# Patient Record
Sex: Female | Born: 1950
Health system: Southern US, Community
[De-identification: ages and names within clinical notes are randomized; demographics above are authoritative.]

## PROBLEM LIST (undated history)

## (undated) DIAGNOSIS — E559 Vitamin D deficiency, unspecified: Secondary | ICD-10-CM

## (undated) DIAGNOSIS — N8110 Cystocele, unspecified: Secondary | ICD-10-CM

## (undated) DIAGNOSIS — R55 Syncope and collapse: Secondary | ICD-10-CM

## (undated) DIAGNOSIS — T7840XA Allergy, unspecified, initial encounter: Secondary | ICD-10-CM

## (undated) HISTORY — DX: Allergy, unspecified, initial encounter: T78.40XA

## (undated) HISTORY — DX: Syncope and collapse: R55

## (undated) HISTORY — DX: Vitamin D deficiency, unspecified: E55.9

## (undated) HISTORY — PX: TONSILLECTOMY AND ADENOIDECTOMY: SUR1326

## (undated) HISTORY — PX: LEG SURGERY: SHX1003

## (undated) HISTORY — DX: Cystocele, unspecified: N81.10

---

## 2008-11-17 ENCOUNTER — Emergency Department (HOSPITAL_BASED_OUTPATIENT_CLINIC_OR_DEPARTMENT_OTHER): Admission: EM | Admit: 2008-11-17 | Discharge: 2008-11-17 | Payer: Self-pay | Admitting: Emergency Medicine

## 2008-11-17 ENCOUNTER — Ambulatory Visit: Payer: Self-pay | Admitting: Diagnostic Radiology

## 2009-07-14 ENCOUNTER — Other Ambulatory Visit: Admission: RE | Admit: 2009-07-14 | Discharge: 2009-07-14 | Payer: Self-pay | Admitting: Internal Medicine

## 2010-07-31 ENCOUNTER — Encounter: Admission: RE | Admit: 2010-07-31 | Discharge: 2010-07-31 | Payer: Self-pay | Admitting: Internal Medicine

## 2010-12-07 LAB — DIFFERENTIAL
Basophils Relative: 0 % (ref 0–1)
Eosinophils Absolute: 0.1 10*3/uL (ref 0.0–0.7)
Eosinophils Relative: 2 % (ref 0–5)
Monocytes Absolute: 0.5 10*3/uL (ref 0.1–1.0)
Monocytes Relative: 6 % (ref 3–12)

## 2010-12-07 LAB — BASIC METABOLIC PANEL
CO2: 26 mEq/L (ref 19–32)
Chloride: 107 mEq/L (ref 96–112)
GFR calc Af Amer: >60 mL/min (ref >60)
Sodium: 139 mEq/L (ref 135–145)

## 2010-12-07 LAB — CBC
HCT: 39.8 % (ref 36.0–46.0)
Hemoglobin: 13.7 g/dL (ref 12.0–15.0)
MCHC: 34.5 g/dL (ref 30.0–36.0)
MCV: 90.3 fL (ref 78.0–100.0)
RBC: 4.41 MIL/uL (ref 3.87–5.11)

## 2010-12-07 LAB — URINALYSIS, ROUTINE W REFLEX MICROSCOPIC
Specific Gravity, Urine: 1.005 (ref 1.005–1.030)
Urobilinogen, UA: 1 mg/dL (ref 0.0–1.0)

## 2010-12-07 LAB — URINE MICROSCOPIC-ADD ON

## 2010-12-07 LAB — POCT CARDIAC MARKERS: CKMB, poc: <1 ng/mL — ABNORMAL LOW (ref 1.0–8.0)

## 2013-12-14 ENCOUNTER — Other Ambulatory Visit (HOSPITAL_COMMUNITY)
Admission: RE | Admit: 2013-12-14 | Discharge: 2013-12-14 | Disposition: A | Discharge status: Home / Self Care | Payer: BC Managed Care – PPO | Source: Ambulatory Visit | Attending: Nurse Practitioner | Admitting: Nurse Practitioner

## 2013-12-14 ENCOUNTER — Encounter: Payer: Self-pay | Admitting: Nurse Practitioner

## 2013-12-14 ENCOUNTER — Ambulatory Visit (INDEPENDENT_AMBULATORY_CARE_PROVIDER_SITE_OTHER): Payer: BC Managed Care – PPO | Admitting: Nurse Practitioner

## 2013-12-14 VITALS — BP 136/78 | HR 72 | Temp 97.6°F | Resp 18 | Ht 64.0 in | Wt 128.0 lb

## 2013-12-14 DIAGNOSIS — N8111 Cystocele, midline: Secondary | ICD-10-CM

## 2013-12-14 DIAGNOSIS — Z1151 Encounter for screening for human papillomavirus (HPV): Secondary | ICD-10-CM | POA: Insufficient documentation

## 2013-12-14 DIAGNOSIS — Z01419 Encounter for gynecological examination (general) (routine) without abnormal findings: Secondary | ICD-10-CM | POA: Insufficient documentation

## 2013-12-14 DIAGNOSIS — Z0000 Encounter for general adult medical examination without abnormal findings: Secondary | ICD-10-CM | POA: Insufficient documentation

## 2013-12-14 DIAGNOSIS — Z780 Asymptomatic menopausal state: Secondary | ICD-10-CM

## 2013-12-14 DIAGNOSIS — N8110 Cystocele, unspecified: Secondary | ICD-10-CM | POA: Insufficient documentation

## 2013-12-14 LAB — COMPREHENSIVE METABOLIC PANEL
ALBUMIN: 4 g/dL (ref 3.5–5.2)
ALK PHOS: 83 U/L (ref 39–117)
ALT: 17 U/L (ref 0–35)
AST: 20 U/L (ref 0–37)
BUN: 12 mg/dL (ref 6–23)
CO2: 26 mEq/L (ref 19–32)
Calcium: 9.3 mg/dL (ref 8.4–10.5)
Chloride: 101 mEq/L (ref 96–112)
Creatinine, Ser: 0.8 mg/dL (ref 0.4–1.2)
GFR: 79.11 mL/min (ref >60.00)
Glucose, Bld: 90 mg/dL (ref 70–99)
POTASSIUM: 3.6 meq/L (ref 3.5–5.1)
SODIUM: 136 meq/L (ref 135–145)
Total Bilirubin: 1 mg/dL (ref 0.3–1.2)
Total Protein: 7.1 g/dL (ref 6.0–8.3)

## 2013-12-14 LAB — URINALYSIS, ROUTINE W REFLEX MICROSCOPIC
BILIRUBIN URINE: NEGATIVE
KETONES UR: NEGATIVE
Leukocytes, UA: NEGATIVE
NITRITE: NEGATIVE
PH: 6 (ref 5.0–8.0)
Specific Gravity, Urine: <=1.005 — AB (ref 1.000–1.030)
TOTAL PROTEIN, URINE-UPE24: NEGATIVE
URINE GLUCOSE: NEGATIVE
Urobilinogen, UA: 0.2 (ref 0.0–1.0)
WBC, UA: NONE SEEN (ref 0–?)

## 2013-12-14 LAB — CBC WITH DIFFERENTIAL/PLATELET
BASOS PCT: 0.5 % (ref 0.0–3.0)
Basophils Absolute: 0 10*3/uL (ref 0.0–0.1)
Eosinophils Absolute: 0.1 10*3/uL (ref 0.0–0.7)
Eosinophils Relative: 1.7 % (ref 0.0–5.0)
HEMATOCRIT: 40.7 % (ref 36.0–46.0)
HEMOGLOBIN: 13.8 g/dL (ref 12.0–15.0)
LYMPHS ABS: 2.5 10*3/uL (ref 0.7–4.0)
LYMPHS PCT: 36.6 % (ref 12.0–46.0)
MCHC: 34 g/dL (ref 30.0–36.0)
MCV: 89.3 fl (ref 78.0–100.0)
MONOS PCT: 7.4 % (ref 3.0–12.0)
Monocytes Absolute: 0.5 10*3/uL (ref 0.1–1.0)
NEUTROS ABS: 3.7 10*3/uL (ref 1.4–7.7)
Neutrophils Relative %: 53.8 % (ref 43.0–77.0)
Platelets: 182 10*3/uL (ref 150.0–400.0)
RBC: 4.55 Mil/uL (ref 3.87–5.11)
RDW: 12.7 % (ref 11.5–14.6)
WBC: 6.8 10*3/uL (ref 4.5–10.5)

## 2013-12-14 LAB — LIPID PANEL
CHOL/HDL RATIO: 4
CHOLESTEROL: 188 mg/dL (ref 0–200)
HDL: 45.8 mg/dL (ref >39.00)
LDL CALC: 130 mg/dL — AB (ref 0–99)
TRIGLYCERIDES: 60 mg/dL (ref 0.0–149.0)
VLDL: 12 mg/dL (ref 0.0–40.0)

## 2013-12-14 LAB — VITAMIN B12

## 2013-12-14 LAB — T4, FREE: Free T4: 0.91 ng/dL (ref 0.60–1.60)

## 2013-12-14 LAB — HEMOGLOBIN A1C: Hgb A1c MFr Bld: 5.7 % (ref 4.6–6.5)

## 2013-12-14 LAB — TSH: TSH: 0.5 u[IU]/mL (ref 0.35–5.50)

## 2013-12-14 NOTE — Progress Notes (Signed)
Pre visit review using our clinic review tool, if applicable. No additional management support is needed unless otherwise documented below in the visit note. 

## 2013-12-14 NOTE — Progress Notes (Signed)
Subjective:     Haley Fields is a 63 y.o. female and is here for a comprehensive physical exam. The patient reports problems - L lower leg & foot numbness, decreased strength when dorsiflexes. Seeing Dr Haley Fields. Next appt. in May. Onset 3 weeks ago. Due to decreased ability to dorsiflex, she has tripped few times-Fall Risk.   History   Social History  . Marital Status: Single    Spouse Name: N/A    Number of Children: 2  . Years of Education: N/A   Occupational History  . retired     Charity fundraiserN, ICU   Social History Main Topics  . Smoking status: Former Games developermoker  . Smokeless tobacco: Not on file  . Alcohol Use: Not on file  . Drug Use: Not on file  . Sexual Activity: Not on file   Other Topics Concern  . 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 She has 2 grown sons, 3 grandsons and 1 granddaughter. She lives in [place] with her fiance.   Health Maintenance  Topic Date Due  . Zostavax  04/15/2010  . Mammogram  07/31/2012  . Influenza Vaccine  03/27/2014  . Pap Smear  12/14/2016  . Tetanus/tdap  12/14/2016  . Colonoscopy  12/14/2020    The following portions of the patient's history were reviewed and updated as appropriate: allergies, current medications, past family history, past medical history, past social history, past surgical history and problem list.  Review of Systems Constitutional: negative for chills, fatigue, fevers and night sweats Eyes: positive for contacts/glasses Ears, nose, mouth, throat, and face: positive for nasal congestion Respiratory: negative for wheezing and has occasional cough secondary to post-nasal drip Cardiovascular: negative for chest pain, chest pressure/discomfort, irregular heart beat, lower extremity edema and palpitations Gastrointestinal: negative for abdominal pain, change in bowel habits, constipation, diarrhea and dyspepsia Genitourinary:positive for bladder prolapse, negative for urinary  incontinence Integument/breast: negative for rash Musculoskeletal:positive for paresthesia L lower leg & foot w/decreased a bility to dorsiflex, negative for back pain and myalgias Neurological: positive for coordination problems, gait problems, paresthesia and weakness, negative for dizziness, headaches and seizures Behavioral/Psych: negative for excessive alcohol consumption, illegal drug usage, sleep disturbance and tobacco use Allergic/Immunologic: positive for seasonal allergies   Objective:    BP 136/78  Pulse 72  Temp(Src) 97.6 F (36.4 C) (Oral)  Resp 18  Ht 5\' 4"  (1.626 m)  Wt 128 lb (58.06 kg)  BMI 21.96 kg/m2  SpO2 97% General appearance: alert, cooperative, appears stated age and no distress Head: Normocephalic, without obvious abnormality, atraumatic Eyes: negative findings: lids and lashes normal, conjunctivae and sclerae normal, corneas clear and pupils equal, round, reactive to light and accomodation Ears: normal TM's and external ear canals both ears and minimum effusion R TM, clear, bones visible Throat: lips, mucosa, and tongue normal; teeth and gums normal Back: symmetric, no curvature. ROM normal. No CVA tenderness., neg sitting leg raise Lungs: clear to auscultation bilaterally Breasts: normal appearance, no masses or tenderness Heart: regular rate and rhythm, S1, S2 normal, no murmur, click, rub or gallop Abdomen: soft, non-tender; bowel sounds normal; no masses,  no organomegaly Pelvic: adnexae not palpable, cervix normal in appearance, external genitalia normal, no adnexal masses or tenderness, no bladder tenderness, no cervical motion tenderness, perianal skin: no external genital warts noted, positive findings: vaginal atrophy c/w post menopausal female. scant bleeding at cervix when swabbed. and uterus normal size, shape, and consistency Extremities: decreased strength on dorsiflexion  L foot. No edema, feet cool. Pulses: 2+ and symmetric Skin: cool feet, dry  patches skin feet & ankles Lymph nodes: Cervical, supraclavicular, and axillary nodes normal. and no inguinal LAD Neurologic: Cranial nerves: normal    Assessment:     1. Preventative health care Vaccines UTD, Recmd shingles. MMG due. Colo UTD. Last Pap 2-3 ya. Good reported diet & exercise habits. - CBC with Differential, - HIV antibody - Hemoglobin A1c - Lipid panel - T4, free - TSH - Urinalysis, Routine w reflex microscopic - Vit D  25 hydroxy (rtn osteoporosis monitoring) - Vitamin B12 - Comprehensive metabolic panel - MM DIGITAL SCREENING BILATERAL; Future - DG Bone Density; Future - Cytology - PAP  2. Post-menopausal - Vit D  25 hydroxy (rtn osteoporosis monitoring) - DG Bone Density; Future - vaginal atrophy. Discussed estrogen cream if UTI or dyspareunia. Using water soluble lubricant.    Plan:    Next CPE 1 yr.  See pt instructions. See After Visit Summary for Counseling Recommendations

## 2013-12-14 NOTE — Patient Instructions (Signed)
Our office will call you with lab results and any necessary follow up. Consider getting shingles vaccine. Pleasure to meet you!  Preventive Care for Adults, Female A healthy lifestyle and preventive care can promote health and wellness. Preventive health guidelines for women include the following key practices.  A routine yearly physical is a good way to check with your caregiver about your health and preventive screening. It is a chance to share any concerns and updates on your health, and to receive a thorough exam.  Visit your dentist for a routine exam and preventive care every 6 months. Brush your teeth twice a day and floss once a day. Good oral hygiene prevents tooth decay and gum disease.  The frequency of eye exams is based on your age, health, family medical history, use of contact lenses, and other factors. Follow your caregiver's recommendations for frequency of eye exams.  Eat a healthy diet. Foods like vegetables, fruits, whole grains, low-fat dairy products, and lean protein foods contain the nutrients you need without too many calories. Decrease your intake of foods high in solid fats, added sugars, and salt. Eat the right amount of calories for you.Get information about a proper diet from your caregiver, if necessary.  Regular physical exercise is one of the most important things you can do for your health. Most adults should get at least 150 minutes of moderate-intensity exercise (any activity that increases your heart rate and causes you to sweat) each week. In addition, most adults need muscle-strengthening exercises on 2 or more days a week.  Maintain a healthy weight. The body mass index (BMI) is a screening tool to identify possible weight problems. It provides an estimate of body fat based on height and weight. Your caregiver can help determine your BMI, and can help you achieve or maintain a healthy weight.For adults 20 years and older:  A BMI below 18.5 is considered  underweight.  A BMI of 18.5 to 24.9 is normal.  A BMI of 25 to 29.9 is considered overweight.  A BMI of 30 and above is considered obese.  Maintain normal blood lipids and cholesterol levels by exercising and minimizing your intake of saturated fat. Eat a balanced diet with plenty of fruit and vegetables. Blood tests for lipids and cholesterol should begin at age 20 and be repeated every 5 years. If your lipid or cholesterol levels are high, you are over 50, or you are at high risk for heart disease, you may need your cholesterol levels checked more frequently.Ongoing high lipid and cholesterol levels should be treated with medicines if diet and exercise are not effective.  If you smoke, find out from your caregiver how to quit. If you do not use tobacco, do not start.  Lung cancer screening is recommended for adults aged 55 80 years who are at high risk for developing lung cancer because of a history of smoking. Yearly low-dose computed tomography (CT) is recommended for people who have at least a 30-pack-year history of smoking and are a current smoker or have quit within the past 15 years. A pack year of smoking is smoking an average of 1 pack of cigarettes a day for 1 year (for example: 1 pack a day for 30 years or 2 packs a day for 15 years). Yearly screening should continue until the smoker has stopped smoking for at least 15 years. Yearly screening should also be stopped for people who develop a health problem that would prevent them from having lung cancer treatment.    If you are pregnant, do not drink alcohol. If you are breastfeeding, be very cautious about drinking alcohol. If you are not pregnant and choose to drink alcohol, do not exceed 1 drink per day. One drink is considered to be 12 ounces (355 mL) of beer, 5 ounces (148 mL) of wine, or 1.5 ounces (44 mL) of liquor.  Avoid use of street drugs. Do not share needles with anyone. Ask for help if you need support or instructions about  stopping the use of drugs.  High blood pressure causes heart disease and increases the risk of stroke. Your blood pressure should be checked at least every 1 to 2 years. Ongoing high blood pressure should be treated with medicines if weight loss and exercise are not effective.  If you are 55 to 64 years old, ask your caregiver if you should take aspirin to prevent strokes.  Diabetes screening involves taking a blood sample to check your fasting blood sugar level. This should be done once every 3 years, after age 45, if you are within normal weight and without risk factors for diabetes. Testing should be considered at a younger age or be carried out more frequently if you are overweight and have at least 1 risk factor for diabetes.  Breast cancer screening is essential preventive care for women. You should practice "breast self-awareness." This means understanding the normal appearance and feel of your breasts and may include breast self-examination. Any changes detected, no matter how small, should be reported to a caregiver. Women in their 20s and 30s should have a clinical breast exam (CBE) by a caregiver as part of a regular health exam every 1 to 3 years. After age 40, women should have a CBE every year. Starting at age 40, women should consider having a mammography (breast X-ray test) every year. Women who have a family history of breast cancer should talk to their caregiver about genetic screening. Women at a high risk of breast cancer should talk to their caregivers about having magnetic resonance imaging (MRI) and a mammography every year.  Breast cancer gene (BRCA)-related cancer risk assessment is recommended for women who have family members with BRCA-related cancers. BRCA-related cancers include breast, ovarian, tubal, and peritoneal cancers. Having family members with these cancers may be associated with an increased risk for harmful changes (mutations) in the breast cancer genes BRCA1 and  BRCA2. Results of the assessment will determine the need for genetic counseling and BRCA1 and BRCA2 testing.  The Pap test is a screening test for cervical cancer. A Pap test can show cell changes on the cervix that might become cervical cancer if left untreated. A Pap test is a procedure in which cells are obtained and examined from the lower end of the uterus (cervix).  Women should have a Pap test starting at age 21.  Between ages 21 and 29, Pap tests should be repeated every 2 years.  Beginning at age 30, you should have a Pap test every 3 years as long as the past 3 Pap tests have been normal.  Some women have medical problems that increase the chance of getting cervical cancer. Talk to your caregiver about these problems. It is especially important to talk to your caregiver if a new problem develops soon after your last Pap test. In these cases, your caregiver may recommend more frequent screening and Pap tests.  The above recommendations are the same for women who have or have not gotten the vaccine for human papillomavirus (HPV).    If you had a hysterectomy for a problem that was not cancer or a condition that could lead to cancer, then you no longer need Pap tests. Even if you no longer need a Pap test, a regular exam is a good idea to make sure no other problems are starting.  If you are between ages 65 and 70, and you have had normal Pap tests going back 10 years, you no longer need Pap tests. Even if you no longer need a Pap test, a regular exam is a good idea to make sure no other problems are starting.  If you have had past treatment for cervical cancer or a condition that could lead to cancer, you need Pap tests and screening for cancer for at least 20 years after your treatment.  If Pap tests have been discontinued, risk factors (such as a new sexual partner) need to be reassessed to determine if screening should be resumed.  The HPV test is an additional test that may be used  for cervical cancer screening. The HPV test looks for the virus that can cause the cell changes on the cervix. The cells collected during the Pap test can be tested for HPV. The HPV test could be used to screen women aged 30 years and older, and should be used in women of any age who have unclear Pap test results. After the age of 30, women should have HPV testing at the same frequency as a Pap test.  Colorectal cancer can be detected and often prevented. Most routine colorectal cancer screening begins at the age of 50 and continues through age 75. However, your caregiver may recommend screening at an earlier age if you have risk factors for colon cancer. On a yearly basis, your caregiver may provide home test kits to check for hidden blood in the stool. Use of a small camera at the end of a tube, to directly examine the colon (sigmoidoscopy or colonoscopy), can detect the earliest forms of colorectal cancer. Talk to your caregiver about this at age 50, when routine screening begins. Direct examination of the colon should be repeated every 5 to 10 years through age 75, unless early forms of pre-cancerous polyps or small growths are found.  Hepatitis C blood testing is recommended for all people born from 1945 through 1965 and any individual with known risks for hepatitis C.  Practice safe sex. Use condoms and avoid high-risk sexual practices to reduce the spread of sexually transmitted infections (STIs). STIs include gonorrhea, chlamydia, syphilis, trichomonas, herpes, HPV, and human immunodeficiency virus (HIV). Herpes, HIV, and HPV are viral illnesses that have no cure. They can result in disability, cancer, and death. Sexually active women aged 25 and younger should be checked for chlamydia. Older women with new or multiple partners should also be tested for chlamydia. Testing for other STIs is recommended if you are sexually active and at increased risk.  Osteoporosis is a disease in which the bones  lose minerals and strength with aging. This can result in serious bone fractures. The risk of osteoporosis can be identified using a bone density scan. Women ages 65 and over and women at risk for fractures or osteoporosis should discuss screening with their caregivers. Ask your caregiver whether you should take a calcium supplement or vitamin D to reduce the rate of osteoporosis.  Menopause can be associated with physical symptoms and risks. Hormone replacement therapy is available to decrease symptoms and risks. You should talk to your caregiver about whether hormone   replacement therapy is right for you.  Use sunscreen. Apply sunscreen liberally and repeatedly throughout the day. You should seek shade when your shadow is shorter than you. Protect yourself by wearing long sleeves, pants, a wide-brimmed hat, and sunglasses year round, whenever you are outdoors.  Once a month, do a whole body skin exam, using a mirror to look at the skin on your back. Notify your caregiver of new moles, moles that have irregular borders, moles that are larger than a pencil eraser, or moles that have changed in shape or color.  Stay current with required immunizations.  Influenza vaccine. All adults should be immunized every year.  Tetanus, diphtheria, and acellular pertussis (Td, Tdap) vaccine. Pregnant women should receive 1 dose of Tdap vaccine during each pregnancy. The dose should be obtained regardless of the length of time since the last dose. Immunization is preferred during the 27th to 36th week of gestation. An adult who has not previously received Tdap or who does not know her vaccine status should receive 1 dose of Tdap. This initial dose should be followed by tetanus and diphtheria toxoids (Td) booster doses every 10 years. Adults with an unknown or incomplete history of completing a 3-dose immunization series with Td-containing vaccines should begin or complete a primary immunization series including a Tdap  dose. Adults should receive a Td booster every 10 years.  Varicella vaccine. An adult without evidence of immunity to varicella should receive 2 doses or a second dose if she has previously received 1 dose. Pregnant females who do not have evidence of immunity should receive the first dose after pregnancy. This first dose should be obtained before leaving the health care facility. The second dose should be obtained 4 8 weeks after the first dose.  Human papillomavirus (HPV) vaccine. Females aged 63 26 years who have not received the vaccine previously should obtain the 3-dose series. The vaccine is not recommended for use in pregnant females. However, pregnancy testing is not needed before receiving a dose. If a female is found to be pregnant after receiving a dose, no treatment is needed. In that case, the remaining doses should be delayed until after the pregnancy. Immunization is recommended for any person with an immunocompromised condition through the age of 6 years if she did not get any or all doses earlier. During the 3-dose series, the second dose should be obtained 4 8 weeks after the first dose. The third dose should be obtained 24 weeks after the first dose and 16 weeks after the second dose.  Zoster vaccine. One dose is recommended for adults aged 77 years or older unless certain conditions are present.  Measles, mumps, and rubella (MMR) vaccine. Adults born before 1 generally are considered immune to measles and mumps. Adults born in [birth year] or later should have 1 or more doses of MMR vaccine unless there is a contraindication to the vaccine or there is laboratory evidence of immunity to each of the three diseases. A routine second dose of MMR vaccine should be obtained at least 28 days after the first dose for students attending postsecondary schools, health care workers, or international travelers. People who received inactivated measles vaccine or an unknown type of measles vaccine during  1963 1967 should receive 2 doses of MMR vaccine. People who received inactivated mumps vaccine or an unknown type of mumps vaccine before 1979 and are at high risk for mumps infection should consider immunization with 2 doses of MMR vaccine. For females of childbearing age, rubella  immunity should be determined. If there is no evidence of immunity, females who are not pregnant should be vaccinated. If there is no evidence of immunity, females who are pregnant should delay immunization until after pregnancy. Unvaccinated health care workers born before [birth year] who lack laboratory evidence of measles, mumps, or rubella immunity or laboratory confirmation of disease should consider measles and mumps immunization with 2 doses of MMR vaccine or rubella immunization with 1 dose of MMR vaccine.  Pneumococcal 13-valent conjugate (PCV13) vaccine. When indicated, a person who is uncertain of her immunization history and has no record of immunization should receive the PCV13 vaccine. An adult aged 38 years or older who has certain medical conditions and has not been previously immunized should receive 1 dose of PCV13 vaccine. This PCV13 should be followed with a dose of pneumococcal polysaccharide (PPSV23) vaccine. The PPSV23 vaccine dose should be obtained at least 8 weeks after the dose of PCV13 vaccine. An adult aged 49 years or older who has certain medical conditions and previously received 1 or more doses of PPSV23 vaccine should receive 1 dose of PCV13. The PCV13 vaccine dose should be obtained 1 or more years after the last PPSV23 vaccine dose.  Pneumococcal polysaccharide (PPSV23) vaccine. When PCV13 is also indicated, PCV13 should be obtained first. All adults aged 54 years and older should be immunized. An adult younger than age 72 years who has certain medical conditions should be immunized. Any person who resides in a nursing home or long-term care facility should be immunized. An adult smoker should be  immunized. People with an immunocompromised condition and certain other conditions should receive both PCV13 and PPSV23 vaccines. People with human immunodeficiency virus (HIV) infection should be immunized as soon as possible after diagnosis. Immunization during chemotherapy or radiation therapy should be avoided. Routine use of PPSV23 vaccine is not recommended for American Indians, [place] Natives, or people younger than 65 years unless there are medical conditions that require PPSV23 vaccine. When indicated, people who have unknown immunization and have no record of immunization should receive PPSV23 vaccine. One-time revaccination 5 years after the first dose of PPSV23 is recommended for people aged 46 64 years who have chronic kidney failure, nephrotic syndrome, asplenia, or immunocompromised conditions. People who received 1 2 doses of PPSV23 before age 61 years should receive another dose of PPSV23 vaccine at age 73 years or later if at least 5 years have passed since the previous dose. Doses of PPSV23 are not needed for people immunized with PPSV23 at or after age 86 years.  Meningococcal vaccine. Adults with asplenia or persistent complement component deficiencies should receive 2 doses of quadrivalent meningococcal conjugate (MenACWY-D) vaccine. The doses should be obtained at least 2 months apart. Microbiologists working with certain meningococcal bacteria, [employer] recruits, people at risk during an outbreak, and people who travel to or live in countries with a high rate of meningitis should be immunized. A first-year college student up through age 43 years who is living in a residence hall should receive a dose if she did not receive a dose on or after her 16th birthday. Adults who have certain high-risk conditions should receive one or more doses of vaccine.  Hepatitis A vaccine. Adults who wish to be protected from this disease, have certain high-risk conditions, work with hepatitis A-infected  animals, work in hepatitis A research labs, or travel to or work in countries with a high rate of hepatitis A should be immunized. Adults who were previously unvaccinated and who  anticipate close contact with an international adoptee during the first 60 days after arrival in the [place] from a country with a high rate of hepatitis A should be immunized.  Hepatitis B vaccine. Adults who wish to be protected from this disease, have certain high-risk conditions, may be exposed to blood or other infectious body fluids, are household contacts or sex partners of hepatitis B positive people, are clients or workers in certain care facilities, or travel to or work in countries with a high rate of hepatitis B should be immunized.  Haemophilus influenzae type b (Hib) vaccine. A previously unvaccinated person with asplenia or sickle cell disease or having a scheduled splenectomy should receive 1 dose of Hib vaccine. Regardless of previous immunization, a recipient of a hematopoietic stem cell transplant should receive a 3-dose series 6 12 months after her successful transplant. Hib vaccine is not recommended for adults with HIV infection. Preventive Services / Frequency Ages 101 to 64  Blood pressure check.** / Every 1 to 2 years.  Lipid and cholesterol check.** / Every 5 years beginning at age 87.  Lung cancer screening. / Every year if you are aged 72 80 years and have a 30-pack-year history of smoking and currently smoke or have quit within the past 15 years. Yearly screening is stopped once you have quit smoking for at least 15 years or develop a health problem that would prevent you from having lung cancer treatment.  Clinical breast exam.** / Every year after age 59.  BRCA-related cancer risk assessment.** / For women who have family members with a BRCA-related cancer (breast, ovarian, tubal, or peritoneal cancers).  Mammogram.** / Every year beginning at age 47 and continuing for as long as you  are in good health. Consult with your caregiver.  Pap test.** / Every 3 years starting at age 68 through age 55 or 38 with a history of 3 consecutive normal Pap tests.  HPV screening.** / Every 3 years from ages 56 through ages 25 to 27 with a history of 3 consecutive normal Pap tests.  Fecal occult blood test (FOBT) of stool. / Every year beginning at age 108 and continuing until age 12. You may not need to do this test if you get a colonoscopy every 10 years.  Flexible sigmoidoscopy or colonoscopy.** / Every 5 years for a flexible sigmoidoscopy or every 10 years for a colonoscopy beginning at age 92 and continuing until age 65.  Hepatitis C blood test.** / For all people born from [birth year] through 1965 and any individual with known risks for hepatitis C.  Skin self-exam. / Monthly.  Influenza vaccine. / Every year.  Tetanus, diphtheria, and acellular pertussis (Tdap/Td) vaccine.** / Consult your caregiver. Pregnant women should receive 1 dose of Tdap vaccine during each pregnancy. 1 dose of Td every 10 years.  Varicella vaccine.** / Consult your caregiver. Pregnant females who do not have evidence of immunity should receive the first dose after pregnancy.  Zoster vaccine.** / 1 dose for adults aged 94 years or older.  Measles, mumps, rubella (MMR) vaccine.** / You need at least 1 dose of MMR if you were born in 1957 or later. You may also need a 2nd dose. For females of childbearing age, rubella immunity should be determined. If there is no evidence of immunity, females who are not pregnant should be vaccinated. If there is no evidence of immunity, females who are pregnant should delay immunization until after pregnancy.  Pneumococcal 13-valent conjugate (PCV13) vaccine.** / Consult  your caregiver.  Pneumococcal polysaccharide (PPSV23) vaccine.** / 1 to 2 doses if you smoke cigarettes or if you have certain conditions.  Meningococcal vaccine.** / Consult your caregiver.  Hepatitis A  vaccine.** / Consult your caregiver.  Hepatitis B vaccine.** / Consult your caregiver.  Haemophilus influenzae type b (Hib) vaccine.** / Consult your caregiver. ** Family history and personal history of risk and conditions may change your caregiver's recommendations. Document Released: 10/09/2001 Document Revised: 12/08/2012 Document Reviewed: 01/08/2011 [employer] Patient Information 2014 [employer]. 

## 2013-12-15 ENCOUNTER — Other Ambulatory Visit: Payer: Self-pay | Admitting: Nurse Practitioner

## 2013-12-15 DIAGNOSIS — Z1231 Encounter for screening mammogram for malignant neoplasm of breast: Secondary | ICD-10-CM

## 2013-12-15 LAB — VITAMIN D 25 HYDROXY (VIT D DEFICIENCY, FRACTURES): VIT D 25 HYDROXY: 15 ng/mL — AB (ref 30–89)

## 2013-12-15 LAB — HIV ANTIBODY (ROUTINE TESTING W REFLEX): HIV 1&2 Ab, 4th Generation: NONREACTIVE

## 2013-12-30 ENCOUNTER — Ambulatory Visit
Admission: RE | Admit: 2013-12-30 | Discharge: 2013-12-30 | Disposition: A | Discharge status: Home / Self Care | Payer: BC Managed Care – PPO | Source: Ambulatory Visit | Attending: Nurse Practitioner | Admitting: Nurse Practitioner

## 2013-12-30 ENCOUNTER — Telehealth: Payer: Self-pay | Admitting: Nurse Practitioner

## 2013-12-30 DIAGNOSIS — Z0000 Encounter for general adult medical examination without abnormal findings: Secondary | ICD-10-CM

## 2013-12-30 DIAGNOSIS — Z780 Asymptomatic menopausal state: Secondary | ICD-10-CM

## 2013-12-30 DIAGNOSIS — Z1231 Encounter for screening mammogram for malignant neoplasm of breast: Secondary | ICD-10-CM

## 2013-12-30 NOTE — Telephone Encounter (Signed)
pls call pt: Advise DEXA scan shows she has osteopenia-mild thinning of bones. Please schedule OV to discuss measures that will prevent further thinning.

## 2013-12-30 NOTE — Telephone Encounter (Signed)
Patient notified of results and scheduled ov appt for 03/08/14 at 9:00 am.

## 2014-01-11 ENCOUNTER — Telehealth: Payer: Self-pay | Admitting: Nurse Practitioner

## 2014-01-11 ENCOUNTER — Other Ambulatory Visit: Payer: Self-pay | Admitting: Nurse Practitioner

## 2014-01-11 DIAGNOSIS — N952 Postmenopausal atrophic vaginitis: Secondary | ICD-10-CM

## 2014-01-11 MED ORDER — ESTRADIOL 10 MCG VA TABS: 10.0000 ug | ORAL_TABLET | VAGINAL | Status: DC

## 2014-01-11 NOTE — Progress Notes (Unsigned)
Discussed estrogen cream for atrophic vaginitis at last visit. Pt has decided to use tabs.

## 2014-01-11 NOTE — Progress Notes (Signed)
Called pt, advised Rx sent to pharmacy. Pt understood.

## 2014-01-11 NOTE — Telephone Encounter (Signed)
The estrogen cream, can we send in Rx for pt?

## 2014-01-11 NOTE — Telephone Encounter (Signed)
Patient said a vaginal cream was mentioned in her last OV. She has decided she does want to try it. Please send in Rx to [facility].

## 2014-03-08 ENCOUNTER — Ambulatory Visit (INDEPENDENT_AMBULATORY_CARE_PROVIDER_SITE_OTHER): Payer: BC Managed Care – PPO | Admitting: Nurse Practitioner

## 2014-03-08 ENCOUNTER — Encounter: Payer: Self-pay | Admitting: Nurse Practitioner

## 2014-03-08 VITALS — BP 102/68 | HR 74 | Temp 98.4°F | Ht 64.0 in | Wt 125.0 lb

## 2014-03-08 DIAGNOSIS — M899 Disorder of bone, unspecified: Secondary | ICD-10-CM

## 2014-03-08 DIAGNOSIS — E559 Vitamin D deficiency, unspecified: Secondary | ICD-10-CM

## 2014-03-08 DIAGNOSIS — M949 Disorder of cartilage, unspecified: Secondary | ICD-10-CM

## 2014-03-08 DIAGNOSIS — Z780 Asymptomatic menopausal state: Secondary | ICD-10-CM

## 2014-03-08 DIAGNOSIS — M8580 Other specified disorders of bone density and structure, unspecified site: Secondary | ICD-10-CM

## 2014-03-08 NOTE — Assessment & Plan Note (Addendum)
Plan: D3 5000 iu daily, recheck in 11/15. Weight bearing activity, 30 mins daily. Limit coffee-2 cups daily, No ETOH. Limit meat to 2-3/week to slow calcium loss.  600 Mg Ca/qd through dark leafy greens. Repeat DEXA in 2 yrs.

## 2014-03-08 NOTE — Progress Notes (Signed)
Pre visit review using our clinic review tool, if applicable. No additional management support is needed unless otherwise documented below in the visit note. 

## 2014-03-08 NOTE — Assessment & Plan Note (Signed)
5000 iu qd D3 Check level in Nov, 2015.

## 2014-03-08 NOTE — Progress Notes (Signed)
Subjective:     Haley Fields is a 63 y.o. female presents to discuss bone density & lab results. DEXA results discussed in detail. Osteopenia discussed in terms of diagnosis, & treatment to slow progression. Labs discussed: lipids-no statin needed 5% risk for ASCVD in 10 yrs; A1C 5.7-maintain weight & exercise; vitamin D extremely low-discussed associations w/low D including AI disease, bone loss, cancers. Discussed Treatment. Pt states her son is deficient although he is landscaper-out in sun daily all year. Also her mother was deficient. Genetic? There are 10 genes r/t D metabolism (4 processes occur in liver). We also discussed recent OV w/Dr Harvin HazelWang-she is wearing brace for foot drop. Cause of nerve damage unk. She reports strength * sensation 90% better. Still using vaginal cream 2/wk w/relief of vaginal irritation.  The following portions of the patient's history were reviewed and updated as appropriate: allergies, current medications, past family history, past medical history, past social history, past surgical history and problem list.  Review of Systems Pertinent items are noted in HPI.    Objective:    BP 102/68  Pulse 74  Temp(Src) 98.4 F (36.9 C) (Temporal)  Ht 5\' 4"  (1.626 m)  Wt 125 lb (56.7 kg)  BMI 21.45 kg/m2  SpO2 97% BP 102/68  Pulse 74  Temp(Src) 98.4 F (36.9 C) (Temporal)  Ht 5\' 4"  (1.626 m)  Wt 125 lb (56.7 kg)  BMI 21.45 kg/m2  SpO2 97% General appearance: alert, cooperative, appears stated age and no distress Head: Normocephalic, without obvious abnormality, atraumatic Eyes: negative findings: lids and lashes normal and conjunctivae and sclerae normal   Extremities: no LE edema Assessment:     1. Unspecified vitamin D deficiency 2. Osteopenia 3. Post-menopausal See problem list for complete A&P F/u 4 mos.

## 2014-03-08 NOTE — Assessment & Plan Note (Signed)
Continue vaginal estrogen cream 2/wk for vag irritation.

## 2014-05-17 ENCOUNTER — Telehealth: Payer: Self-pay

## 2014-05-17 NOTE — Telephone Encounter (Signed)
Immunization documentation

## 2014-05-20 ENCOUNTER — Ambulatory Visit (INDEPENDENT_AMBULATORY_CARE_PROVIDER_SITE_OTHER): Payer: BC Managed Care – PPO

## 2014-05-20 DIAGNOSIS — Z23 Encounter for immunization: Secondary | ICD-10-CM

## 2014-07-08 ENCOUNTER — Telehealth: Payer: Self-pay | Admitting: Nurse Practitioner

## 2014-07-08 ENCOUNTER — Encounter: Payer: Self-pay | Admitting: Nurse Practitioner

## 2014-07-08 ENCOUNTER — Ambulatory Visit (INDEPENDENT_AMBULATORY_CARE_PROVIDER_SITE_OTHER): Payer: BC Managed Care – PPO | Admitting: Nurse Practitioner

## 2014-07-08 VITALS — BP 112/68 | HR 67 | Temp 97.9°F | Resp 18 | Ht 64.0 in | Wt 127.0 lb

## 2014-07-08 DIAGNOSIS — M8580 Other specified disorders of bone density and structure, unspecified site: Secondary | ICD-10-CM

## 2014-07-08 DIAGNOSIS — N952 Postmenopausal atrophic vaginitis: Secondary | ICD-10-CM | POA: Insufficient documentation

## 2014-07-08 DIAGNOSIS — E559 Vitamin D deficiency, unspecified: Secondary | ICD-10-CM

## 2014-07-08 LAB — VITAMIN D 25 HYDROXY (VIT D DEFICIENCY, FRACTURES): VITD: 54.87 ng/mL (ref 30.00–100.00)

## 2014-07-08 NOTE — Progress Notes (Signed)
Pre visit review using our clinic review tool, if applicable. No additional management support is needed unless otherwise documented below in the visit note. 

## 2014-07-08 NOTE — Progress Notes (Signed)
Subjective:     Haley Fields is a 63 y.o. female presents for f/u of vitamin D deficicency & osteopenia, & postmenopausal vaginal atrophy. Re Vit D def: she is taking 5000 iu daily D3, tolerating w/out SE. She mentions her son & mother both had Vit D  Def. Last DEXA scan showed osteopenia. She is performing wt bearing exercise, limiting meat consumption, caffeine & ETOH intake to prevent Ca loss. She is trying to get 600 mg ca through dark leafy green foods. Estrogen cream is improving vaginal atrophy & dryness-feels more comfortable, will continue.  The following portions of the patient's history were reviewed and updated as appropriate: allergies, current medications, past family history, past medical history, past social history, past surgical history and problem list.  Review of Systems Constitutional: negative for fatigue and fevers Gastrointestinal: negative for diarrhea    Objective:    BP 112/68 mmHg  Pulse 67  Temp(Src) 97.9 F (36.6 C) (Oral)  Resp 18  Ht 5\' 4"  (1.626 m)  Wt 127 lb (57.607 kg)  BMI 21.79 kg/m2  SpO2 98% BP 112/68 mmHg  Pulse 67  Temp(Src) 97.9 F (36.6 C) (Oral)  Resp 18  Ht 5\' 4"  (1.626 m)  Wt 127 lb (57.607 kg)  BMI 21.79 kg/m2  SpO2 98% General appearance: alert, cooperative, appears stated age and no distress Head: Normocephalic, without obvious abnormality, atraumatic Eyes: negative findings: lids and lashes normal and conjunctivae and sclerae normal Lungs: clear to auscultation bilaterally Heart: regular rate and rhythm, S1, S2 normal, no murmur, click, rub or gallop    Assessment:plan     1. Vitamin D deficiency - Vit D  25 hydroxy (rtn osteoporosis monitoring)  2. Osteopenia Continue diet modification to prevent ca loss & get 600 mg ca qd Cont wt bearing exercise  3. Vaginal atrophy Continue estrogen cream PRN F/u CPE 6 mos.

## 2014-07-08 NOTE — Telephone Encounter (Signed)
pls call pt: Advise Vit d is therapeutic. Decrease dose to 1000 iu D3 daily. Ill check level again at physical in Spring.

## 2014-07-08 NOTE — Patient Instructions (Addendum)
We will call with lab results and any adjustment to Vitamin D supplement.  Continue estrogen cream PRN  Continue diet modification to prevent ca loss & get 600 mg ca qd Cont wt bearing exercise Happy Thanksgiving! Nice to see you!

## 2014-07-09 NOTE — Telephone Encounter (Signed)
Patient notified of results.

## 2014-07-30 ENCOUNTER — Other Ambulatory Visit: Payer: Self-pay

## 2014-07-30 DIAGNOSIS — N952 Postmenopausal atrophic vaginitis: Secondary | ICD-10-CM

## 2014-07-30 MED ORDER — ESTRADIOL 10 MCG VA TABS: 10.0000 ug | ORAL_TABLET | VAGINAL | Status: DC

## 2014-07-30 NOTE — Telephone Encounter (Signed)
[facility] [place] on Vagifem 10mcg vaginal tab. Last OV 07/08/2014

## 2015-02-15 ENCOUNTER — Encounter: Payer: Self-pay | Admitting: Nurse Practitioner

## 2015-02-17 ENCOUNTER — Ambulatory Visit (INDEPENDENT_AMBULATORY_CARE_PROVIDER_SITE_OTHER): Payer: 59 | Admitting: Nurse Practitioner

## 2015-02-17 ENCOUNTER — Encounter: Payer: Self-pay | Admitting: Nurse Practitioner

## 2015-02-17 VITALS — BP 118/78 | HR 80 | Temp 98.0°F | Resp 16 | Ht 63.5 in | Wt 138.0 lb

## 2015-02-17 DIAGNOSIS — Z91048 Other nonmedicinal substance allergy status: Secondary | ICD-10-CM | POA: Diagnosis not present

## 2015-02-17 DIAGNOSIS — Z0000 Encounter for general adult medical examination without abnormal findings: Secondary | ICD-10-CM | POA: Diagnosis not present

## 2015-02-17 DIAGNOSIS — Z9109 Other allergy status, other than to drugs and biological substances: Secondary | ICD-10-CM

## 2015-02-17 DIAGNOSIS — N952 Postmenopausal atrophic vaginitis: Secondary | ICD-10-CM | POA: Diagnosis not present

## 2015-02-17 LAB — COMPREHENSIVE METABOLIC PANEL
ALT: 12 U/L (ref 0–35)
AST: 18 U/L (ref 0–37)
Albumin: 4.3 g/dL (ref 3.5–5.2)
Alkaline Phosphatase: 73 U/L (ref 39–117)
BILIRUBIN TOTAL: 0.7 mg/dL (ref 0.2–1.2)
BUN: 17 mg/dL (ref 6–23)
CO2: 31 meq/L (ref 19–32)
CREATININE: 0.87 mg/dL (ref 0.40–1.20)
Calcium: 9.5 mg/dL (ref 8.4–10.5)
Chloride: 101 mEq/L (ref 96–112)
GFR: 69.49 mL/min (ref >60.00)
Glucose, Bld: 90 mg/dL (ref 70–99)
Potassium: 4.6 mEq/L (ref 3.5–5.1)
SODIUM: 137 meq/L (ref 135–145)
Total Protein: 6.9 g/dL (ref 6.0–8.3)

## 2015-02-17 LAB — CBC WITH DIFFERENTIAL/PLATELET
BASOS ABS: 0 10*3/uL (ref 0.0–0.1)
Basophils Relative: 0.4 % (ref 0.0–3.0)
EOS ABS: 0.1 10*3/uL (ref 0.0–0.7)
Eosinophils Relative: 3 % (ref 0.0–5.0)
HCT: 42.7 % (ref 36.0–46.0)
Hemoglobin: 14.3 g/dL (ref 12.0–15.0)
LYMPHS PCT: 37.7 % (ref 12.0–46.0)
Lymphs Abs: 1.8 10*3/uL (ref 0.7–4.0)
MCHC: 33.4 g/dL (ref 30.0–36.0)
MCV: 92.2 fl (ref 78.0–100.0)
MONO ABS: 0.5 10*3/uL (ref 0.1–1.0)
Monocytes Relative: 10 % (ref 3.0–12.0)
NEUTROS PCT: 48.9 % (ref 43.0–77.0)
Neutro Abs: 2.3 10*3/uL (ref 1.4–7.7)
Platelets: 188 10*3/uL (ref 150.0–400.0)
RBC: 4.63 Mil/uL (ref 3.87–5.11)
RDW: 13.1 % (ref 11.5–15.5)
WBC: 4.7 10*3/uL (ref 4.0–10.5)

## 2015-02-17 LAB — LIPID PANEL
CHOLESTEROL: 187 mg/dL (ref 0–200)
HDL: 55.1 mg/dL (ref >39.00)
LDL CALC: 120 mg/dL — AB (ref 0–99)
NonHDL: 131.9
TRIGLYCERIDES: 59 mg/dL (ref 0.0–149.0)
Total CHOL/HDL Ratio: 3
VLDL: 11.8 mg/dL (ref 0.0–40.0)

## 2015-02-17 LAB — URINALYSIS, MICROSCOPIC ONLY

## 2015-02-17 LAB — VITAMIN D 25 HYDROXY (VIT D DEFICIENCY, FRACTURES): VITD: 34.38 ng/mL (ref 30.00–100.00)

## 2015-02-17 LAB — TSH: TSH: 1.65 u[IU]/mL (ref 0.35–4.50)

## 2015-02-17 MED ORDER — ESTRADIOL 10 MCG VA TABS: 10.0000 ug | ORAL_TABLET | VAGINAL | Status: DC

## 2015-02-17 NOTE — Patient Instructions (Signed)
Please consider using sinus rinses daily for best allergy management. Milta Deiters med sinus wash.  Keep exercising and eating healthy!  Develop lifelong habits of exercise most days of the week: take a 30 minute walk. The benefits include weight loss, lower risk for heart disease, diabetes, stroke, high blood pressure, lower rates of depression & dementia, better sleep quality & bone health.  Continue to cut out refined sugar: anything that is sweet when you eat or drink it, except fresh fruit. Cut out refined grains: white bread, rolls, biscuits, bagels, muffins, pasta and cereals. Choose grains with 4 gm or more of fiber per serving.  Eat at least 5 servings of fruit & vegetables daily. Minimize animal proteins & fats to 3-4 times weekly. Eat plant fats daily-nuts, seeds, avacados, & olives.   Mammogram is due next year, colonoscopy in 2022.  It has been a pleasure to know & care for you!  Our office will call you with lab results and any necessary follow up. Pleasure to meet you!  Preventive Care for Adults, Female A healthy lifestyle and preventive care can promote health and wellness. Preventive health guidelines for women include the following key practices.  A routine yearly physical is a good way to check with your caregiver about your health and preventive screening. It is a chance to share any concerns and updates on your health, and to receive a thorough exam.  Visit your dentist for a routine exam and preventive care every 6 months. Brush your teeth twice a day and floss once a day. Good oral hygiene prevents tooth decay and gum disease.  The frequency of eye exams is based on your age, health, family medical history, use of contact lenses, and other factors. Follow your caregiver's recommendations for frequency of eye exams.  Eat a healthy diet. Foods like vegetables, fruits, whole grains, low-fat dairy products, and lean protein foods contain the nutrients you need without too many  calories. Decrease your intake of foods high in solid fats, added sugars, and salt. Eat the right amount of calories for you.Get information about a proper diet from your caregiver, if necessary.  Regular physical exercise is one of the most important things you can do for your health. Most adults should get at least 150 minutes of moderate-intensity exercise (any activity that increases your heart rate and causes you to sweat) each week. In addition, most adults need muscle-strengthening exercises on 2 or more days a week.  Maintain a healthy weight. The body mass index (BMI) is a screening tool to identify possible weight problems. It provides an estimate of body fat based on height and weight. Your caregiver can help determine your BMI, and can help you achieve or maintain a healthy weight.For adults 20 years and older:  A BMI below 18.5 is considered underweight.  A BMI of 18.5 to 24.9 is normal.  A BMI of 25 to 29.9 is considered overweight.  A BMI of 30 and above is considered obese.  Maintain normal blood lipids and cholesterol levels by exercising and minimizing your intake of saturated fat. Eat a balanced diet with plenty of fruit and vegetables. Blood tests for lipids and cholesterol should begin at age 47 and be repeated every 5 years. If your lipid or cholesterol levels are high, you are over 50, or you are at high risk for heart disease, you may need your cholesterol levels checked more frequently.Ongoing high lipid and cholesterol levels should be treated with medicines if diet and exercise are  not effective.  If you smoke, find out from your caregiver how to quit. If you do not use tobacco, do not start.  Lung cancer screening is recommended for adults aged 61 80 years who are at high risk for developing lung cancer because of a history of smoking. Yearly low-dose computed tomography (CT) is recommended for people who have at least a 30-pack-year history of smoking and are a  current smoker or have quit within the past 15 years. A pack year of smoking is smoking an average of 1 pack of cigarettes a day for 1 year (for example: 1 pack a day for 30 years or 2 packs a day for 15 years). Yearly screening should continue until the smoker has stopped smoking for at least 15 years. Yearly screening should also be stopped for people who develop a health problem that would prevent them from having lung cancer treatment.  If you are pregnant, do not drink alcohol. If you are breastfeeding, be very cautious about drinking alcohol. If you are not pregnant and choose to drink alcohol, do not exceed 1 drink per day. One drink is considered to be 12 ounces (355 mL) of beer, 5 ounces (148 mL) of wine, or 1.5 ounces (44 mL) of liquor.  Avoid use of street drugs. Do not share needles with anyone. Ask for help if you need support or instructions about stopping the use of drugs.  High blood pressure causes heart disease and increases the risk of stroke. Your blood pressure should be checked at least every 1 to 2 years. Ongoing high blood pressure should be treated with medicines if weight loss and exercise are not effective.  If you are 82 to 65 years old, ask your caregiver if you should take aspirin to prevent strokes.  Diabetes screening involves taking a blood sample to check your fasting blood sugar level. This should be done once every 3 years, after age 60, if you are within normal weight and without risk factors for diabetes. Testing should be considered at a younger age or be carried out more frequently if you are overweight and have at least 1 risk factor for diabetes.  Breast cancer screening is essential preventive care for women. You should practice "breast self-awareness." This means understanding the normal appearance and feel of your breasts and may include breast self-examination. Any changes detected, no matter how small, should be reported to a caregiver. Women in their 55s  and 30s should have a clinical breast exam (CBE) by a caregiver as part of a regular health exam every 1 to 3 years. After age 17, women should have a CBE every year. Starting at age 87, women should consider having a mammography (breast X-ray test) every year. Women who have a family history of breast cancer should talk to their caregiver about genetic screening. Women at a high risk of breast cancer should talk to their caregivers about having magnetic resonance imaging (MRI) and a mammography every year.  Breast cancer gene (BRCA)-related cancer risk assessment is recommended for women who have family members with BRCA-related cancers. BRCA-related cancers include breast, ovarian, tubal, and peritoneal cancers. Having family members with these cancers may be associated with an increased risk for harmful changes (mutations) in the breast cancer genes BRCA1 and BRCA2. Results of the assessment will determine the need for genetic counseling and BRCA1 and BRCA2 testing.  The Pap test is a screening test for cervical cancer. A Pap test can show cell changes on the cervix  that might become cervical cancer if left untreated. A Pap test is a procedure in which cells are obtained and examined from the lower end of the uterus (cervix).  Women should have a Pap test starting at age 76.  Between ages 58 and 40, Pap tests should be repeated every 2 years.  Beginning at age 38, you should have a Pap test every 3 years as long as the past 3 Pap tests have been normal.  Some women have medical problems that increase the chance of getting cervical cancer. Talk to your caregiver about these problems. It is especially important to talk to your caregiver if a new problem develops soon after your last Pap test. In these cases, your caregiver may recommend more frequent screening and Pap tests.  The above recommendations are the same for women who have or have not gotten the vaccine for human papillomavirus (HPV).  If  you had a hysterectomy for a problem that was not cancer or a condition that could lead to cancer, then you no longer need Pap tests. Even if you no longer need a Pap test, a regular exam is a good idea to make sure no other problems are starting.  If you are between ages 110 and 33, and you have had normal Pap tests going back 10 years, you no longer need Pap tests. Even if you no longer need a Pap test, a regular exam is a good idea to make sure no other problems are starting.  If you have had past treatment for cervical cancer or a condition that could lead to cancer, you need Pap tests and screening for cancer for at least 20 years after your treatment.  If Pap tests have been discontinued, risk factors (such as a new sexual partner) need to be reassessed to determine if screening should be resumed.  The HPV test is an additional test that may be used for cervical cancer screening. The HPV test looks for the virus that can cause the cell changes on the cervix. The cells collected during the Pap test can be tested for HPV. The HPV test could be used to screen women aged 42 years and older, and should be used in women of any age who have unclear Pap test results. After the age of 44, women should have HPV testing at the same frequency as a Pap test.  Colorectal cancer can be detected and often prevented. Most routine colorectal cancer screening begins at the age of 60 and continues through age 62. However, your caregiver may recommend screening at an earlier age if you have risk factors for colon cancer. On a yearly basis, your caregiver may provide home test kits to check for hidden blood in the stool. Use of a small camera at the end of a tube, to directly examine the colon (sigmoidoscopy or colonoscopy), can detect the earliest forms of colorectal cancer. Talk to your caregiver about this at age 55, when routine screening begins. Direct examination of the colon should be repeated every 5 to 10 years  through age 53, unless early forms of pre-cancerous polyps or small growths are found.  Hepatitis C blood testing is recommended for all people born from [birth year] through 1965 and any individual with known risks for hepatitis C.  Practice safe sex. Use condoms and avoid high-risk sexual practices to reduce the spread of sexually transmitted infections (STIs). STIs include gonorrhea, chlamydia, syphilis, trichomonas, herpes, HPV, and human immunodeficiency virus (HIV). Herpes, HIV, and HPV are  viral illnesses that have no cure. They can result in disability, cancer, and death. Sexually active women aged 38 and younger should be checked for chlamydia. Older women with new or multiple partners should also be tested for chlamydia. Testing for other STIs is recommended if you are sexually active and at increased risk.  Osteoporosis is a disease in which the bones lose minerals and strength with aging. This can result in serious bone fractures. The risk of osteoporosis can be identified using a bone density scan. Women ages 31 and over and women at risk for fractures or osteoporosis should discuss screening with their caregivers. Ask your caregiver whether you should take a calcium supplement or vitamin D to reduce the rate of osteoporosis.  Menopause can be associated with physical symptoms and risks. Hormone replacement therapy is available to decrease symptoms and risks. You should talk to your caregiver about whether hormone replacement therapy is right for you.  Use sunscreen. Apply sunscreen liberally and repeatedly throughout the day. You should seek shade when your shadow is shorter than you. Protect yourself by wearing long sleeves, pants, a wide-brimmed hat, and sunglasses year round, whenever you are outdoors.  Once a month, do a whole body skin exam, using a mirror to look at the skin on your back. Notify your caregiver of new moles, moles that have irregular borders, moles that are larger than a  pencil eraser, or moles that have changed in shape or color.  Stay current with required immunizations.  Influenza vaccine. All adults should be immunized every year.  Tetanus, diphtheria, and acellular pertussis (Td, Tdap) vaccine. Pregnant women should receive 1 dose of Tdap vaccine during each pregnancy. The dose should be obtained regardless of the length of time since the last dose. Immunization is preferred during the 27th to 36th week of gestation. An adult who has not previously received Tdap or who does not know her vaccine status should receive 1 dose of Tdap. This initial dose should be followed by tetanus and diphtheria toxoids (Td) booster doses every 10 years. Adults with an unknown or incomplete history of completing a 3-dose immunization series with Td-containing vaccines should begin or complete a primary immunization series including a Tdap dose. Adults should receive a Td booster every 10 years.  Varicella vaccine. An adult without evidence of immunity to varicella should receive 2 doses or a second dose if she has previously received 1 dose. Pregnant females who do not have evidence of immunity should receive the first dose after pregnancy. This first dose should be obtained before leaving the health care facility. The second dose should be obtained 4 8 weeks after the first dose.  Human papillomavirus (HPV) vaccine. Females aged 52 26 years who have not received the vaccine previously should obtain the 3-dose series. The vaccine is not recommended for use in pregnant females. However, pregnancy testing is not needed before receiving a dose. If a female is found to be pregnant after receiving a dose, no treatment is needed. In that case, the remaining doses should be delayed until after the pregnancy. Immunization is recommended for any person with an immunocompromised condition through the age of 77 years if she did not get any or all doses earlier. During the 3-dose series, the second  dose should be obtained 4 8 weeks after the first dose. The third dose should be obtained 24 weeks after the first dose and 16 weeks after the second dose.  Zoster vaccine. One dose is recommended for adults  aged 73 years or older unless certain conditions are present.  Measles, mumps, and rubella (MMR) vaccine. Adults born before [birth year] generally are considered immune to measles and mumps. Adults born in [birth year] or later should have 1 or more doses of MMR vaccine unless there is a contraindication to the vaccine or there is laboratory evidence of immunity to each of the three diseases. A routine second dose of MMR vaccine should be obtained at least 28 days after the first dose for students attending postsecondary schools, health care workers, or international travelers. People who received inactivated measles vaccine or an unknown type of measles vaccine during 1963 1967 should receive 2 doses of MMR vaccine. People who received inactivated mumps vaccine or an unknown type of mumps vaccine before 1979 and are at high risk for mumps infection should consider immunization with 2 doses of MMR vaccine. For females of childbearing age, rubella immunity should be determined. If there is no evidence of immunity, females who are not pregnant should be vaccinated. If there is no evidence of immunity, females who are pregnant should delay immunization until after pregnancy. Unvaccinated health care workers born before [birth year] who lack laboratory evidence of measles, mumps, or rubella immunity or laboratory confirmation of disease should consider measles and mumps immunization with 2 doses of MMR vaccine or rubella immunization with 1 dose of MMR vaccine.  Pneumococcal 13-valent conjugate (PCV13) vaccine. When indicated, a person who is uncertain of her immunization history and has no record of immunization should receive the PCV13 vaccine. An adult aged 13 years or older who has certain medical conditions and has not been  previously immunized should receive 1 dose of PCV13 vaccine. This PCV13 should be followed with a dose of pneumococcal polysaccharide (PPSV23) vaccine. The PPSV23 vaccine dose should be obtained at least 8 weeks after the dose of PCV13 vaccine. An adult aged 45 years or older who has certain medical conditions and previously received 1 or more doses of PPSV23 vaccine should receive 1 dose of PCV13. The PCV13 vaccine dose should be obtained 1 or more years after the last PPSV23 vaccine dose.  Pneumococcal polysaccharide (PPSV23) vaccine. When PCV13 is also indicated, PCV13 should be obtained first. All adults aged 63 years and older should be immunized. An adult younger than age 61 years who has certain medical conditions should be immunized. Any person who resides in a nursing home or long-term care facility should be immunized. An adult smoker should be immunized. People with an immunocompromised condition and certain other conditions should receive both PCV13 and PPSV23 vaccines. People with human immunodeficiency virus (HIV) infection should be immunized as soon as possible after diagnosis. Immunization during chemotherapy or radiation therapy should be avoided. Routine use of PPSV23 vaccine is not recommended for American Indians, Eldred Natives, or people younger than 65 years unless there are medical conditions that require PPSV23 vaccine. When indicated, people who have unknown immunization and have no record of immunization should receive PPSV23 vaccine. One-time revaccination 5 years after the first dose of PPSV23 is recommended for people aged 6 64 years who have chronic kidney failure, nephrotic syndrome, asplenia, or immunocompromised conditions. People who received 1 2 doses of PPSV23 before age 86 years should receive another dose of PPSV23 vaccine at age 24 years or later if at least 5 years have passed since the previous dose. Doses of PPSV23 are not needed for people immunized with PPSV23 at or  after age 40 years.  Meningococcal vaccine. Adults with asplenia or  persistent complement component deficiencies should receive 2 doses of quadrivalent meningococcal conjugate (MenACWY-D) vaccine. The doses should be obtained at least 2 months apart. Microbiologists working with certain meningococcal bacteria, [employer] recruits, people at risk during an outbreak, and people who travel to or live in countries with a high rate of meningitis should be immunized. A first-year college student up through age 17 years who is living in a residence hall should receive a dose if she did not receive a dose on or after her 16th birthday. Adults who have certain high-risk conditions should receive one or more doses of vaccine.  Hepatitis A vaccine. Adults who wish to be protected from this disease, have certain high-risk conditions, work with hepatitis A-infected animals, work in hepatitis A research labs, or travel to or work in countries with a high rate of hepatitis A should be immunized. Adults who were previously unvaccinated and who anticipate close contact with an international adoptee during the first 60 days after arrival in the [place] from a country with a high rate of hepatitis A should be immunized.  Hepatitis B vaccine. Adults who wish to be protected from this disease, have certain high-risk conditions, may be exposed to blood or other infectious body fluids, are household contacts or sex partners of hepatitis B positive people, are clients or workers in certain care facilities, or travel to or work in countries with a high rate of hepatitis B should be immunized.  Haemophilus influenzae type b (Hib) vaccine. A previously unvaccinated person with asplenia or sickle cell disease or having a scheduled splenectomy should receive 1 dose of Hib vaccine. Regardless of previous immunization, a recipient of a hematopoietic stem cell transplant should receive a 3-dose series 6 12 months after her successful  transplant. Hib vaccine is not recommended for adults with HIV infection. Preventive Services / Frequency Ages 43 to 77  Blood pressure check.** / Every 1 to 2 years.  Lipid and cholesterol check.** / Every 5 years beginning at age 80.  Clinical breast exam.** / Every 3 years for women in their 95s and 60s.  BRCA-related cancer risk assessment.** / For women who have family members with a BRCA-related cancer (breast, ovarian, tubal, or peritoneal cancers).  Pap test.** / Every 2 years from ages 47 through 51. Every 3 years starting at age 26 through age 61 or 30 with a history of 3 consecutive normal Pap tests.  HPV screening.** / Every 3 years from ages 62 through ages 36 to 58 with a history of 3 consecutive normal Pap tests.  Hepatitis C blood test.** / For any individual with known risks for hepatitis C.  Skin self-exam. / Monthly.  Influenza vaccine. / Every year.  Tetanus, diphtheria, and acellular pertussis (Tdap, Td) vaccine.** / Consult your caregiver. Pregnant women should receive 1 dose of Tdap vaccine during each pregnancy. 1 dose of Td every 10 years.  Varicella vaccine.** / Consult your caregiver. Pregnant females who do not have evidence of immunity should receive the first dose after pregnancy.  HPV vaccine. / 3 doses over 6 months, if 32 and younger. The vaccine is not recommended for use in pregnant females. However, pregnancy testing is not needed before receiving a dose.  Measles, mumps, rubella (MMR) vaccine.** / You need at least 1 dose of MMR if you were born in 1957 or later. You may also need a 2nd dose. For females of childbearing age, rubella immunity should be determined. If there is no evidence of immunity, females  who are not pregnant should be vaccinated. If there is no evidence of immunity, females who are pregnant should delay immunization until after pregnancy.  Pneumococcal 13-valent conjugate (PCV13) vaccine.** / Consult your  caregiver.  Pneumococcal polysaccharide (PPSV23) vaccine.** / 1 to 2 doses if you smoke cigarettes or if you have certain conditions.  Meningococcal vaccine.** / 1 dose if you are age 62 to 36 years and a Market researcher living in a residence hall, or have one of several medical conditions, you need to get vaccinated against meningococcal disease. You may also need additional booster doses.  Hepatitis A vaccine.** / Consult your caregiver.  Hepatitis B vaccine.** / Consult your caregiver.  Haemophilus influenzae type b (Hib) vaccine.** / Consult your caregiver. Ages 79 to 63  Blood pressure check.** / Every 1 to 2 years.  Lipid and cholesterol check.** / Every 5 years beginning at age 40.  Lung cancer screening. / Every year if you are aged 57 80 years and have a 30-pack-year history of smoking and currently smoke or have quit within the past 15 years. Yearly screening is stopped once you have quit smoking for at least 15 years or develop a health problem that would prevent you from having lung cancer treatment.  Clinical breast exam.** / Every year after age 69.  BRCA-related cancer risk assessment.** / For women who have family members with a BRCA-related cancer (breast, ovarian, tubal, or peritoneal cancers).  Mammogram.** / Every year beginning at age 53 and continuing for as long as you are in good health. Consult with your caregiver.  Pap test.** / Every 3 years starting at age 31 through age 69 or 90 with a history of 3 consecutive normal Pap tests.  HPV screening.** / Every 3 years from ages 6 through ages 79 to 29 with a history of 3 consecutive normal Pap tests.  Fecal occult blood test (FOBT) of stool. / Every year beginning at age 75 and continuing until age 66. You may not need to do this test if you get a colonoscopy every 10 years.  Flexible sigmoidoscopy or colonoscopy.** / Every 5 years for a flexible sigmoidoscopy or every 10 years for a colonoscopy  beginning at age 49 and continuing until age 54.  Hepatitis C blood test.** / For all people born from [birth year] through 1966 and any individual with known risks for hepatitis C.  Skin self-exam. / Monthly.  Influenza vaccine. / Every year.  Tetanus, diphtheria, and acellular pertussis (Tdap/Td) vaccine.** / Consult your caregiver. Pregnant women should receive 1 dose of Tdap vaccine during each pregnancy. 1 dose of Td every 10 years.  Varicella vaccine.** / Consult your caregiver. Pregnant females who do not have evidence of immunity should receive the first dose after pregnancy.  Zoster vaccine.** / 1 dose for adults aged 32 years or older.  Measles, mumps, rubella (MMR) vaccine.** / You need at least 1 dose of MMR if you were born in 1957 or later. You may also need a 2nd dose. For females of childbearing age, rubella immunity should be determined. If there is no evidence of immunity, females who are not pregnant should be vaccinated. If there is no evidence of immunity, females who are pregnant should delay immunization until after pregnancy.  Pneumococcal 13-valent conjugate (PCV13) vaccine.** / Consult your caregiver.  Pneumococcal polysaccharide (PPSV23) vaccine.** / 1 to 2 doses if you smoke cigarettes or if you have certain conditions.  Meningococcal vaccine.** / Consult your caregiver.  Hepatitis A vaccine.** /  Consult your caregiver.  Hepatitis B vaccine.** / Consult your caregiver.  Haemophilus influenzae type b (Hib) vaccine.** / Consult your caregiver. Ages 9 and over  Blood pressure check.** / Every 1 to 2 years.  Lipid and cholesterol check.** / Every 5 years beginning at age 3.  Lung cancer screening. / Every year if you are aged 59 80 years and have a 30-pack-year history of smoking and currently smoke or have quit within the past 15 years. Yearly screening is stopped once you have quit smoking for at least 15 years or develop a health problem that would prevent you  from having lung cancer treatment.  Clinical breast exam.** / Every year after age 57.  BRCA-related cancer risk assessment.** / For women who have family members with a BRCA-related cancer (breast, ovarian, tubal, or peritoneal cancers).  Mammogram.** / Every year beginning at age 3 and continuing for as long as you are in good health. Consult with your caregiver.  Pap test.** / Every 3 years starting at age 81 through age 65 or 40 with a 3 consecutive normal Pap tests. Testing can be stopped between 65 and 70 with 3 consecutive normal Pap tests and no abnormal Pap or HPV tests in the past 10 years.  HPV screening.** / Every 3 years from ages 57 through ages 12 or 51 with a history of 3 consecutive normal Pap tests. Testing can be stopped between 65 and 70 with 3 consecutive normal Pap tests and no abnormal Pap or HPV tests in the past 10 years.  Fecal occult blood test (FOBT) of stool. / Every year beginning at age 43 and continuing until age 38. You may not need to do this test if you get a colonoscopy every 10 years.  Flexible sigmoidoscopy or colonoscopy.** / Every 5 years for a flexible sigmoidoscopy or every 10 years for a colonoscopy beginning at age 78 and continuing until age 3.  Hepatitis C blood test.** / For all people born from [birth year] through 1965 and any individual with known risks for hepatitis C.  Osteoporosis screening.** / A one-time screening for women ages 79 and over and women at risk for fractures or osteoporosis.  Skin self-exam. / Monthly.  Influenza vaccine. / Every year.  Tetanus, diphtheria, and acellular pertussis (Tdap/Td) vaccine.** / 1 dose of Td every 10 years.  Varicella vaccine.** / Consult your caregiver.  Zoster vaccine.** / 1 dose for adults aged 100 years or older.  Pneumococcal 13-valent conjugate (PCV13) vaccine.** / Consult your caregiver.  Pneumococcal polysaccharide (PPSV23) vaccine.** / 1 dose for all adults aged 50 years and  older.  Meningococcal vaccine.** / Consult your caregiver.  Hepatitis A vaccine.** / Consult your caregiver.  Hepatitis B vaccine.** / Consult your caregiver.  Haemophilus influenzae type b (Hib) vaccine.** / Consult your caregiver. ** Family history and personal history of risk and conditions may change your caregiver's recommendations. Document Released: 10/09/2001 Document Revised: 12/08/2012 Document Reviewed: 01/08/2011 [facility] Patient Information 2014 [place], [place].

## 2015-02-17 NOTE — Progress Notes (Signed)
Pre visit review using our clinic review tool, if applicable. No additional management support is needed unless otherwise documented below in the visit note. 

## 2015-02-18 ENCOUNTER — Telehealth: Payer: Self-pay | Admitting: Nurse Practitioner

## 2015-02-18 ENCOUNTER — Encounter: Payer: Self-pay | Admitting: Nurse Practitioner

## 2015-02-18 DIAGNOSIS — Z9109 Other allergy status, other than to drugs and biological substances: Secondary | ICD-10-CM | POA: Insufficient documentation

## 2015-02-18 LAB — FECAL OCCULT BLOOD, IMMUNOCHEMICAL: FECAL OCCULT BLD: NEGATIVE

## 2015-02-18 NOTE — Telephone Encounter (Signed)
Pt advised and voiced understanding.   

## 2015-02-18 NOTE — Progress Notes (Signed)
Subjective:     Haley Fields is a 64 y.o. female and is here for a comprehensive physical exam. The patient reports no problems. MMG next yr. No need for paps, exercising daily, eats plant rich diet w/some meat. Vaccines UTD. Taking 1000 iu D3 qd.  History   Social History  . Marital Status: Single    Spouse Name: N/A  . Number of Children: 2  . Years of Education: N/A   Occupational History  . retired     Charity fundraiser, ICU   Social History Main Topics  . Smoking status: Former Games developer  . Smokeless tobacco: Not on file  . Alcohol Use: No  . Drug Use: No  . Sexual Activity: No   Other Topics Concern  . 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 She lives in [place] with her fiance.   Health Maintenance  Topic Date Due  . INFLUENZA VACCINE  03/28/2015  . MAMMOGRAM  12/31/2015  . TETANUS/TDAP  12/14/2016  . COLONOSCOPY  12/14/2020  . ZOSTAVAX  Completed  . HIV Screening  Completed    The following portions of the patient's history were reviewed and updated as appropriate: allergies, current medications, past family history, past medical history, past social history, past surgical history and problem list.  Review of Systems Constitutional: negative for fatigue and fevers Eyes: positive for wears glasses when driving. due for eye exam, no changes Respiratory: negative for cough Cardiovascular: negative for chest pressure/discomfort, lower extremity edema and palpitations Gastrointestinal: negative for change in bowel habits, constipation, diarrhea and dyspepsia Genitourinary:positive for postmenopausal vag atrophy, using vagfem 2d/wk w/relief, negative for postmenopausal bleeding Integument/breast: positive for skin lesion R breast, eval by derm Musculoskeletal:negative for arthralgias and myalgias Behavioral/Psych: negative for sleep disturbance Allergic/Immunologic: positive for seasonal  allergies, using zyrtec daily   Objective:    BP 118/78 mmHg  Pulse 80  Temp(Src) 98 F (36.7 C) (Temporal)  Resp 16  Ht 5' 3.5" (1.613 m)  Wt 138 lb (62.596 kg)  BMI 24.06 kg/m2  SpO2 96% General appearance: alert, cooperative, appears stated age and no distress Eyes: negative findings: lids and lashes normal, conjunctivae and sclerae normal, corneas clear and pupils equal, round, reactive to light and accomodation Ears: normal TM's and external ear canals both ears Throat: lips, mucosa, and tongue normal; teeth and gums normal Neck: no adenopathy, no carotid bruit, supple, symmetrical, trachea midline and thyroid not enlarged, symmetric, no tenderness/mass/nodules Lungs: clear to auscultation bilaterally Heart: regular rate and rhythm, S1, S2 normal, no murmur, click, rub or gallop Abdomen: soft, non-tender; bowel sounds normal; no masses,  no organomegaly Extremities: extremities normal, atraumatic, no cyanosis or edema Pulses: 2+ and symmetric Skin: Skin color, texture, turgor normal. No rashes or lesions or 2 cm seborrheic keratoses R lat breast. several .5 cm keratoses inder L breast Lymph nodes: Cervical, supraclavicular, and axillary nodes normal. Neurologic: Grossly normal    Assessment:Plan   1. Preventative health care Cont plant rich diet & daily exercise  - Lipid panel - TSH - CBC with Differential/Platelet - Comprehensive metabolic panel - Vit D  25 hydroxy (rtn osteoporosis monitoring) - Urine Microscopic - Fecal occult blood, imunochemical  2. Atrophic vaginitis continue - Estradiol 10 MCG TABS vaginal tablet; Place 1 tablet (10 mcg total) vaginally 2 (two) times a week.  Dispense: 8 tablet; Refill: 5  3. Environmental allergies Start daily sinus wash  F/u 1 yr, PRN

## 2015-02-18 NOTE — Telephone Encounter (Signed)
pls call pt: Advise No blood in stool. 

## 2015-02-18 NOTE — Telephone Encounter (Signed)
pls call pt: Advise Labs look great! She should bump up vitamin D to 2000 iu qd with meal.

## 2015-04-15 DIAGNOSIS — S9031XA Contusion of right foot, initial encounter: Secondary | ICD-10-CM | POA: Diagnosis not present

## 2015-04-29 DIAGNOSIS — M79674 Pain in right toe(s): Secondary | ICD-10-CM | POA: Diagnosis not present

## 2015-06-21 ENCOUNTER — Ambulatory Visit (INDEPENDENT_AMBULATORY_CARE_PROVIDER_SITE_OTHER): Payer: Medicare Other

## 2015-06-21 DIAGNOSIS — Z23 Encounter for immunization: Secondary | ICD-10-CM | POA: Diagnosis not present

## 2015-07-05 ENCOUNTER — Encounter: Payer: Self-pay | Admitting: Family Medicine

## 2015-07-05 ENCOUNTER — Ambulatory Visit (INDEPENDENT_AMBULATORY_CARE_PROVIDER_SITE_OTHER): Payer: Medicare Other | Admitting: Family Medicine

## 2015-07-05 VITALS — BP 136/80 | HR 60 | Temp 97.9°F | Resp 20 | Wt 148.0 lb

## 2015-07-05 DIAGNOSIS — J069 Acute upper respiratory infection, unspecified: Secondary | ICD-10-CM

## 2015-07-05 MED ORDER — PREDNISONE 50 MG PO TABS: 50.0000 mg | ORAL_TABLET | Freq: Every day | ORAL | Status: DC

## 2015-07-05 MED ORDER — MOMETASONE FUROATE 50 MCG/ACT NA SUSP: 2.0000 | Freq: Every day | NASAL | Status: DC

## 2015-07-05 NOTE — Progress Notes (Signed)
   Subjective:    Patient ID: Haley Fields, female    DOB: 04/01/1951, 64 y.o.   MRN: [id number]  HPI  Congestion: Patient presents with a ten-day history of ear discomfort and "glands hurting. "  She endorses nasal congestion, rhinorrhea, bilateral ear discomfort and mild nausea. She states she can feel drainage down the back of her throat. She denies fever, chills, sinus pressure, headache, sore throat, abdominal pain, vomiting or diarrhea. Patient reports history of seasonal allergies in which she takes Zyrtec daily. She reports usually when her allergy start getting worse she can take Zyrtec in the subside, but this has not been the case. She does take Mucinex as well, but states she has a hard time tolerating Mucinex for more than a few days as it makes her feel dehydrated. Patient is unable to tolerate Flonase, she states it makes her nosebleed.  Former smoker  Past Medical History  Diagnosis Date  . Allergy   . Bladder prolapse, female, acquired   . Syncope     pt states due to "inner ear". cardiac & stroke w/u NEG.  . Vitamin D deficiency    No Known Allergies  Review of Systems Negative, with the exception of above mentioned in HPI     Objective:   Physical Exam BP 136/80 mmHg  Pulse 60  Temp(Src) 97.9 F (36.6 C) (Oral)  Resp 20  Wt 148 lb (67.132 kg)  SpO2 98% Gen: Afebrile. No acute distress. Nontoxic in appearance, well-developed, well-nourished, Caucasian female, very pleasant. HENT: AT. [place]. Bilateral TM visualized shiny/full in appearance no erythema or bulging. MMM. Bilateral nares mild erythema no swelling Throat without erythema or exudates. No tenderness to palpation of maxillary or frontal sinuses. No cough on exam. Hoarseness on exam. Eyes:Pupils Equal Round Reactive to light, Extraocular movements intact,  Conjunctiva without redness, discharge or icterus. Neck/lymp/endocrine: Supple, mild shotty cervical lymphadenopathy CV: RRR  Chest: CTAB, no wheeze or  crackles     Assessment & Plan:  1. Acute upper respiratory infection - Patient to continue antihistamine of choice recommended either Allegra or Zyrtec. Start Nasonex as directed. Nasal saline flushes 3 times a day. Mucinex continued. Patient given printed prescription for prednisone burst if symptoms aren't improving in 3-4 days, she is to start prednisone. - mometasone (NASONEX) 50 MCG/ACT nasal spray; Place 2 sprays into the nose daily.  Dispense: 17 g; Refill: 0 - predniSONE (DELTASONE) 50 MG tablet; Take 1 tablet (50 mg total) by mouth daily with breakfast.  Dispense: 5 tablet; Refill: 0 Follow-up as needed

## 2015-07-05 NOTE — Patient Instructions (Signed)
Use nasal spray daily for at lest 2-4 weeks. Use nasal saline 3x a day. Start prednisone if worsening symptoms in 3-4 days.  Continue zyrtec or allegra.

## 2015-12-08 ENCOUNTER — Telehealth: Payer: Self-pay | Admitting: Family Medicine

## 2015-12-08 DIAGNOSIS — W57XXXA Bitten or stung by nonvenomous insect and other nonvenomous arthropods, initial encounter: Secondary | ICD-10-CM | POA: Diagnosis not present

## 2015-12-08 DIAGNOSIS — L089 Local infection of the skin and subcutaneous tissue, unspecified: Secondary | ICD-10-CM | POA: Diagnosis not present

## 2015-12-08 NOTE — Telephone Encounter (Signed)
Patient states she got bit by tick on her shoulder on Monday area is red and has burning sensation. She wants to know if she needs to be seen or what she should do for this. Please advise

## 2015-12-08 NOTE — Telephone Encounter (Signed)
Patient is requesting CB. She left a VM this morning.

## 2015-12-08 NOTE — Telephone Encounter (Signed)
Spoke with Dr Claiborne BillingsKuneff Patient advised to go to an Urgent care to have site looked at since our office is closed for the holiday weekend. We have no available appts today. Patient verbalized understanding.

## 2016-03-06 ENCOUNTER — Ambulatory Visit (INDEPENDENT_AMBULATORY_CARE_PROVIDER_SITE_OTHER): Payer: Medicare Other | Admitting: Family Medicine

## 2016-03-06 ENCOUNTER — Encounter: Payer: Self-pay | Admitting: Family Medicine

## 2016-03-06 VITALS — BP 109/71 | HR 82 | Temp 98.0°F | Resp 20 | Ht 63.5 in | Wt 151.8 lb

## 2016-03-06 DIAGNOSIS — L739 Follicular disorder, unspecified: Secondary | ICD-10-CM | POA: Insufficient documentation

## 2016-03-06 MED ORDER — KETOCONAZOLE 2 % EX SHAM: 1.0000 "application " | MEDICATED_SHAMPOO | CUTANEOUS | Status: DC

## 2016-03-06 NOTE — Progress Notes (Signed)
Patient ID: Haley Fields, female   DOB: 01/03/1951, 65 y.o.   MRN: [id number]    Haley Fields , 02-23-1951, 65 y.o., female MRN: [id number] Patient Care Team    Relationship Specialty Notifications Start End  Natalia Leatherwood, DO PCP - General Family Medicine  06/21/15     CC: scalp itchiness Subjective: Pt presents for an acute OV with complaints of scalp dryness of 3 weeks duration.  Associated symptoms include itchy, bumpy scalp. She states the areas are very itchy and after she scratches it, it will burn. It wakes her at night. The area feels a little better after she washes her hair at night, but by morning it is back to itching. She is using aloe vera on it, and it helps a little but. The area is over her crown, left occipital, and ears itch. She has never had this happen to her before. No one else in the household affected. She denies fever, chills, rash in other locations, flakiness of scalp, drainage from sites. She states she notices a mild redness. She denies changes to any products she apply to her scalp. She denies swimming pool or hot tub exposure. She has never had psoriasis, eczema or skin condition.  No Known Allergies Social History  Substance Use Topics  . Smoking status: Former Games developer  . Smokeless tobacco: Not on file  . Alcohol Use: No   Past Medical History  Diagnosis Date  . Allergy   . Bladder prolapse, female, acquired   . Syncope     pt states due to "inner ear". cardiac & stroke w/u NEG.  . Vitamin D deficiency    Past Surgical History  Procedure Laterality Date  . Tonsillectomy and adenoidectomy    . Leg surgery Left     injury. lateral lower leg, hardware   Family History  Problem Relation Age of Onset  . Alzheimer's disease Mother   . Hypertension Mother   . Cancer Father     lung, asbestos, Curator  . Cancer Brother     pancreatic, agent orange     Medication List       This list is accurate as of: 03/06/16 10:47 AM.  Always use your most  recent med list.               cetirizine 10 MG tablet  Commonly known as:  ZYRTEC  Take 10 mg by mouth daily.     fluticasone 50 MCG/ACT nasal spray  Commonly known as:  FLONASE  Place 2 sprays into both nostrils as needed for allergies or rhinitis.        No results found for this or any previous visit (from the past 24 hour(s)). No results found.   ROS: Negative, with the exception of above mentioned in HPI   Objective:  BP 109/71 mmHg  Pulse 82  Temp(Src) 98 F (36.7 C)  Resp 20  Ht 5' 3.5" (1.613 m)  Wt 151 lb 12.8 oz (68.856 kg)  BMI 26.47 kg/m2  SpO2 96% Body mass index is 26.47 kg/(m^2). Gen: Afebrile. No acute distress. Nontoxic in appearance, well developed, well nourished. pleasant female. HENT: AT. [place]. x3 small follicle scabbing, and x1 elliptical shaped (~2cm) dry flalky patch left occipital. Bilateral TM visualized and normal in appearacne. EAM with mild dryness/flakiness proximal end. MMM, no oral lesions.  Eyes:Pupils Equal Round Reactive to light, Extraocular movements intact,  Conjunctiva without redness, discharge or icterus. Skin: No rashes, purpura or petechiae.  Neuro:  Normal  gait. PERLA. EOMi. Alert. Oriented x3  Psych: Normal affect, dress and demeanor. Normal speech. Normal thought content and judgment.  Assessment/Plan: Haley Fields is a 65 y.o. female present for acute OV for  Folliculitis - ? fungal cause of folliculitis/scalp itchiness. Will try ketoconazole shampoo x2 weekly. If not improved or worsening in 2 weeks would want to see back .  - consider psoriasis as potential: however exam not consistent. Does not appear to be dandruff either.  - ketoconazole (NIZORAL) 2 % shampoo; Apply 1 application topically 2 (two) times a week. Apply 5-10 ml to scalp, lather and let stand for 5 minutes, rinse- use twice a week for 4-8 weeks.  Dispense: 120 mL; Refill: 0 - F/U PRN  electronically signed by:  Felix Pacinienee Junior Kenedy, DO  [facility]  - OR

## 2016-03-06 NOTE — Patient Instructions (Signed)
Use shampoo as instructed.  If not improving or worsening will need to consider topical steroid treatment as well.

## 2016-04-19 ENCOUNTER — Encounter: Payer: Self-pay | Admitting: Family Medicine

## 2016-04-19 ENCOUNTER — Ambulatory Visit (INDEPENDENT_AMBULATORY_CARE_PROVIDER_SITE_OTHER): Payer: Medicare Other | Admitting: Family Medicine

## 2016-04-19 VITALS — BP 139/75 | HR 61 | Temp 98.2°F | Resp 20 | Wt 156.0 lb

## 2016-04-19 DIAGNOSIS — L218 Other seborrheic dermatitis: Secondary | ICD-10-CM | POA: Diagnosis not present

## 2016-04-19 DIAGNOSIS — L219 Seborrheic dermatitis, unspecified: Secondary | ICD-10-CM

## 2016-04-19 NOTE — Patient Instructions (Signed)
Try selsun blue for daily dandruff control. You can use the shampoo prescribed once a week as needed.    Seborrheic Dermatitis Seborrheic dermatitis involves pink or red skin with greasy, flaky scales. It usually occurs on the scalp, and it is often called dandruff. This condition may also affect the eyebrows, nose, ears, chest, and the bearded area of men's faces. It often occurs where skin has more oil (sebaceous) glands. It may come and go for no known reason, and it is often long-lasting (chronic). CAUSES The cause is not known. RISK FACTORS This condition is more like to develop in:  People who are stressed or tired.  People who have skin conditions, such as acne.  People who have certain conditions, such as:  HIV (human immunodeficiency virus).  AIDS (acquired immunodeficiency syndrome).  Parkinson disease.  An eating disorder.  Stroke.  Depression.  Epilepsy.  Alcoholism.  People who live in places that have extreme weather.  People who have a family history of seborrheic dermatitis.  People who use skin creams that are made with alcohol.  People who are 66 years old.  People who take certain medicines. SYMPTOMS Symptoms of this condition include:  Thick scales on the scalp.  Redness on the face or in the armpits.  Skin that is flaky. The flakes may be white or yellow.  Skin that seems oily or dry but is not helped with moisturizers.  Itching or burning in the affected areas. DIAGNOSIS This condition is diagnosed with a medical history and physical exam. A sample of your skin may be tested (skin biopsy). You may need to see a skin specialist (dermatologist). TREATMENT There is no cure for this condition, but treatment can help to manage the symptoms. Treatment may include:  Cortisone (steroid) ointments, creams, and lotions.  Over-the-counter or prescription shampoos. HOME CARE INSTRUCTIONS  Apply over-the-counter and prescription medicines  only as told by your health care provider.  Keep all follow-up visits as told by your health care provider. This is important.  Try to reduce your stress, such as with yoga or mediation. If you need help to reduce stress, ask your health care provider.  Shower or bathe as told by your health care provider.  Use any medicated shampoos as told by your health care provider. SEEK MEDICAL CARE IF:  Your symptoms do not improve with treatment.  Your symptoms get worse.  You have new symptoms.   This information is not intended to replace advice given to you by your health care provider. Make sure you discuss any questions you have with your health care provider.   Document Released: 08/13/2005 Document Revised: 05/04/2015 Document Reviewed: 12/29/2014 Elsevier Interactive Patient Education [employer]2016 Elsevier Inc.

## 2016-04-19 NOTE — Progress Notes (Signed)
Patient ID: Haley Fields, female   DOB: 26-Mar-1951, 65 y.o.   MRN: [id number]    Haley Fields , 26-Mar-1951, 65 y.o., female MRN: [id number] Patient Care Team    Relationship Specialty Notifications Start End  Natalia Leatherwoodenee A Kroy Sprung, DO PCP - General Family Medicine  06/21/15     CC: scalp itchiness Subjective:  Pt states the "bumps" are healed and she has no more areas/lesions as she did last time, since starting the ketoconazole shampoo. She now just notices more itchiness of the scalp, especially at night.   Prior note:  Pt presents for an acute OV with complaints of scalp dryness of 3 weeks duration.  Associated symptoms include itchy, bumpy scalp. She states the areas are very itchy and after she scratches it, it will burn. It wakes her at night. The area feels a little better after she washes her hair at night, but by morning it is back to itching. She is using aloe vera on it, and it helps a little but. The area is over her crown, left occipital, and ears itch. She has never had this happen to her before. No one else in the household affected. She denies fever, chills, rash in other locations, flakiness of scalp, drainage from sites. She states she notices a mild redness. She denies changes to any products she apply to her scalp. She denies swimming pool or hot tub exposure. She has never had psoriasis, eczema or skin condition.  No Known Allergies Social History  Substance Use Topics  . Smoking status: Former Games developermoker  . Smokeless tobacco: Never Used  . Alcohol use No   Past Medical History:  Diagnosis Date  . Allergy   . Bladder prolapse, female, acquired   . Syncope    pt states due to "inner ear". cardiac & stroke w/u NEG.  . Vitamin D deficiency    Past Surgical History:  Procedure Laterality Date  . LEG SURGERY Left    injury. lateral lower leg, hardware  . TONSILLECTOMY AND ADENOIDECTOMY     Family History  Problem Relation Age of Onset  . Alzheimer's disease Mother   .  Hypertension Mother   . Cancer Father     lung, asbestos, Curatormechanic  . Cancer Brother     pancreatic, agent orange     Medication List       Accurate as of 04/19/16 12:17 PM. Always use your most recent med list.          cetirizine 10 MG tablet Commonly known as:  ZYRTEC Take 10 mg by mouth daily.   fluticasone 50 MCG/ACT nasal spray Commonly known as:  FLONASE Place 2 sprays into both nostrils as needed for allergies or rhinitis.   ketoconazole 2 % shampoo Commonly known as:  NIZORAL Apply 1 application topically 2 (two) times a week. Apply 5-10 ml to scalp, lather and let stand for 5 minutes, rinse- use twice a week for 4-8 weeks.       No results found for this or any previous visit (from the past 24 hour(s)). No results found.   ROS: Negative, with the exception of above mentioned in HPI   Objective:  BP 139/75 (BP Location: Left Arm, Patient Position: Sitting, Cuff Size: Normal)   Pulse 61   Temp 98.2 F (36.8 C)   Resp 20   Wt 156 lb (70.8 kg)   SpO2 96%   BMI 27.20 kg/m  Body mass index is 27.2 kg/m. Gen: Afebrile. No acute distress. Nontoxic  in appearance, well developed, well nourished. pleasant female. HENT: AT. [place]. x3 Dry flalky scalp only. No erythema, lesions or excoriations. Eyes:Pupils Equal Round Reactive to light, Extraocular movements intact,  Conjunctiva without redness, discharge or icterus. Skin: No rashes, purpura or petechiae.  Neuro:  Normal gait. PERLA. EOMi. Alert. Oriented x3  Psych: Normal affect, dress and demeanor. Normal speech. Normal thought content and judgment.  Assessment/Plan: Haley Fields is a 65 y.o. female present for acute OV for  Folliculitis/seborrheic dermatitis: - Much improved, now only dry scalp remains. She is still have itchy scalp, especially at night.  - [facility], she can use Ketoconazole once a week if needed. - F/U PRN.    electronically signed by:  Felix Pacinienee Fanny Agan, DO   Primary Care  - OR

## 2016-04-24 ENCOUNTER — Encounter: Payer: Self-pay | Admitting: Family Medicine

## 2016-04-24 ENCOUNTER — Telehealth: Payer: Self-pay | Admitting: Family Medicine

## 2016-04-24 ENCOUNTER — Ambulatory Visit (INDEPENDENT_AMBULATORY_CARE_PROVIDER_SITE_OTHER): Payer: Medicare Other | Admitting: Family Medicine

## 2016-04-24 VITALS — BP 122/75 | HR 62 | Temp 98.0°F | Resp 20 | Ht 64.0 in | Wt 154.2 lb

## 2016-04-24 DIAGNOSIS — R413 Other amnesia: Secondary | ICD-10-CM

## 2016-04-24 DIAGNOSIS — Z130 Encounter for screening for diseases of the blood and blood-forming organs and certain disorders involving the immune mechanism: Secondary | ICD-10-CM | POA: Diagnosis not present

## 2016-04-24 DIAGNOSIS — R7309 Other abnormal glucose: Secondary | ICD-10-CM

## 2016-04-24 DIAGNOSIS — Z1159 Encounter for screening for other viral diseases: Secondary | ICD-10-CM

## 2016-04-24 DIAGNOSIS — Z1231 Encounter for screening mammogram for malignant neoplasm of breast: Secondary | ICD-10-CM

## 2016-04-24 DIAGNOSIS — E559 Vitamin D deficiency, unspecified: Secondary | ICD-10-CM

## 2016-04-24 DIAGNOSIS — E785 Hyperlipidemia, unspecified: Secondary | ICD-10-CM

## 2016-04-24 DIAGNOSIS — M8580 Other specified disorders of bone density and structure, unspecified site: Secondary | ICD-10-CM | POA: Diagnosis not present

## 2016-04-24 DIAGNOSIS — E2839 Other primary ovarian failure: Secondary | ICD-10-CM

## 2016-04-24 DIAGNOSIS — Z79899 Other long term (current) drug therapy: Secondary | ICD-10-CM

## 2016-04-24 DIAGNOSIS — Z23 Encounter for immunization: Secondary | ICD-10-CM | POA: Diagnosis not present

## 2016-04-24 DIAGNOSIS — Z0000 Encounter for general adult medical examination without abnormal findings: Secondary | ICD-10-CM

## 2016-04-24 DIAGNOSIS — R7303 Prediabetes: Secondary | ICD-10-CM

## 2016-04-24 LAB — COMPREHENSIVE METABOLIC PANEL
ALT: 14 U/L (ref 0–35)
AST: 17 U/L (ref 0–37)
Albumin: 4.3 g/dL (ref 3.5–5.2)
Alkaline Phosphatase: 66 U/L (ref 39–117)
BUN: 16 mg/dL (ref 6–23)
CO2: 30 mEq/L (ref 19–32)
Calcium: 9.1 mg/dL (ref 8.4–10.5)
Chloride: 102 mEq/L (ref 96–112)
Creatinine, Ser: 0.87 mg/dL (ref 0.40–1.20)
GFR: 69.23 mL/min (ref >60.00)
Glucose, Bld: 95 mg/dL (ref 70–99)
Potassium: 4.3 mEq/L (ref 3.5–5.1)
Sodium: 137 mEq/L (ref 135–145)
Total Bilirubin: 0.6 mg/dL (ref 0.2–1.2)
Total Protein: 6.9 g/dL (ref 6.0–8.3)

## 2016-04-24 LAB — LIPID PANEL
CHOLESTEROL: 198 mg/dL (ref 0–200)
HDL: 49.9 mg/dL (ref >39.00)
LDL CALC: 129 mg/dL — AB (ref 0–99)
NONHDL: 147.83
TRIGLYCERIDES: 93 mg/dL (ref 0.0–149.0)
Total CHOL/HDL Ratio: 4
VLDL: 18.6 mg/dL (ref 0.0–40.0)

## 2016-04-24 LAB — TSH: TSH: 1.4 u[IU]/mL (ref 0.35–4.50)

## 2016-04-24 LAB — VITAMIN D 25 HYDROXY (VIT D DEFICIENCY, FRACTURES): VITD: 20.24 ng/mL — AB (ref 30.00–100.00)

## 2016-04-24 LAB — CBC WITH DIFFERENTIAL/PLATELET
Basophils Absolute: 0 10*3/uL (ref 0.0–0.1)
Basophils Relative: 0.4 % (ref 0.0–3.0)
Eosinophils Absolute: 0.1 10*3/uL (ref 0.0–0.7)
Eosinophils Relative: 2.3 % (ref 0.0–5.0)
HCT: 41.2 % (ref 36.0–46.0)
Hemoglobin: 14.1 g/dL (ref 12.0–15.0)
Lymphocytes Relative: 29.2 % (ref 12.0–46.0)
Lymphs Abs: 1.7 10*3/uL (ref 0.7–4.0)
MCHC: 34.1 g/dL (ref 30.0–36.0)
MCV: 89.9 fl (ref 78.0–100.0)
Monocytes Absolute: 0.5 10*3/uL (ref 0.1–1.0)
Monocytes Relative: 8.6 % (ref 3.0–12.0)
Neutro Abs: 3.4 10*3/uL (ref 1.4–7.7)
Neutrophils Relative %: 59.5 % (ref 43.0–77.0)
Platelets: 206 10*3/uL (ref 150.0–400.0)
RBC: 4.59 Mil/uL (ref 3.87–5.11)
RDW: 12.9 % (ref 11.5–15.5)
WBC: 5.7 10*3/uL (ref 4.0–10.5)

## 2016-04-24 LAB — HEMOGLOBIN A1C: Hgb A1c MFr Bld: 6 % (ref 4.6–6.5)

## 2016-04-24 LAB — VITAMIN B12: Vitamin B-12: 845 pg/mL (ref 211–911)

## 2016-04-24 MED ORDER — TETANUS-DIPHTH-ACELL PERTUSSIS 5-2.5-18.5 LF-MCG/0.5 IM SUSP: 0.5000 mL | Freq: Once | INTRAMUSCULAR | 0 refills | Status: DC

## 2016-04-24 MED ORDER — TETANUS-DIPHTH-ACELL PERTUSSIS 5-2.5-18.5 LF-MCG/0.5 IM SUSP: 0.5000 mL | Freq: Once | INTRAMUSCULAR | 0 refills | Status: AC

## 2016-04-24 NOTE — Telephone Encounter (Signed)
Patient called back to let you know she had her last colonoscopy by Dr. Kinnie ScalesMedoff.

## 2016-04-24 NOTE — Progress Notes (Signed)
Medicare AWV    History of Present Ilness: Haley Fields, 65 y.o. , female presents today for Medicare wellness visit.  Vital Signs: BP 122/75 (BP Location: Right Arm, Patient Position: Sitting, Cuff Size: Normal)   Pulse 62   Temp 98 F (36.7 C)   Resp 20   Ht 5\' 4"  (1.626 m)   Wt 154 lb 4 oz (70 kg)   SpO2 97%   BMI 26.48 kg/m  Patient Care Team    Relationship Specialty Notifications Start End  Natalia Leatherwood, DO PCP - General Family Medicine  06/21/15   Sharrell Ku, MD Consulting Physician Gastroenterology  04/24/16     Past medical, surgical, family and social histories reviewed (including experiences with illnesses, hospital stays, operations, injuries, and treatments):  Past Medical History:  Diagnosis Date  . Allergy   . Bladder prolapse, female, acquired   . Syncope    pt states due to "inner ear". cardiac & stroke w/u NEG.  . Vitamin D deficiency    All allergies reviewed No Known Allergies Past Surgical History:  Procedure Laterality Date  . LEG SURGERY Left    injury. lateral lower leg, hardware  . TONSILLECTOMY AND ADENOIDECTOMY     Family History  Problem Relation Age of Onset  . Alzheimer's disease Mother   . Hypertension Mother   . Cancer Father     lung, asbestos, Curator  . Cancer Brother     pancreatic, agent orange  . 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 She lives in [place] with her fiance.    All medications verified   Medication List       Accurate as of 04/24/16  5:48 PM. Always use your most recent med list.          cetirizine 10 MG tablet Commonly known as:  ZYRTEC Take 10 mg by mouth daily.   fluticasone 50 MCG/ACT nasal spray Commonly known as:  FLONASE Place 2 sprays into both nostrils as needed for allergies or rhinitis.   ketoconazole 2 % shampoo Commonly known as:   NIZORAL Apply 1 application topically 2 (two) times a week. Apply 5-10 ml to scalp, lather and let stand for 5 minutes, rinse- use twice a week for 4-8 weeks.   Tdap 5-2.5-18.5 LF-MCG/0.5 injection Commonly known as:  BOOSTRIX Inject 0.5 mLs into the muscle once.       Health maintenance:  Immunizations: Immunization History  Administered Date(s) Administered  . Influenza,inj,Quad PF,36+ Mos 05/20/2014, 06/21/2015  . Pneumococcal Conjugate-13 04/24/2016  . Zoster 05/14/2014    Exercise: Current Exercise Habits: Home exercise routine, Type of exercise: walking, Time (Minutes): 45, Frequency (Times/Week): 3, Weekly Exercise (Minutes/Week): 135, Intensity: Mild Exercise limited by: None identified  Diet: Regular  Functional Status Survey: Get up and go test: steady and less than 20 seconds.  Is the patient deaf or have difficulty hearing?: No Does the patient have difficulty seeing, even when wearing glasses/contacts?: No Does the patient have difficulty concentrating, remembering, or making decisions?: No Does the patient have difficulty walking or climbing stairs?: No Does the patient have difficulty dressing or bathing?: No Does the patient have difficulty doing errands alone such as visiting a doctor's office or shopping?: No Current Exercise Habits: Home exercise routine, Type of exercise: walking, Time (Minutes): 45, Frequency (Times/Week): 3, Weekly Exercise (Minutes/Week): 135, Intensity:  Mild Exercise limited by: None identified Fall Risk  04/24/2016 12/14/2013  Falls in the past year? No No  Risk for fall due to : - Impaired balance/gait  Risk for fall due to (comments): - secondary to paresthesia L foot & leg, decreased ability to dorsiflex, causes tripping. Seeing ortho.    Glaucoma Screening/visual acuity: Date of Last ophthalmology evaluation:  2 years ago, glasses for distance. Pt encouraged to schedule eye exam.  Done by: Doctor retired doctor, now goes to [employer].  Family history of glaucoma: PGF Coverage: Medicare covers for patients annually with diabetes mellitus, family history of glaucoma, African-Americans age 550 and over, or Hispanic-Americans age 66 and up  Hearing screen:  No barriers identified. Whisper test heard.   Cognitive assessment:  Say/repeat/then ask after clock draw: 2 (forgot [place]) Clock draw/label: 1 (numbers labeled twice) If result < 3, pt will be scheduled for MMSE or referral will be placed. Discussed score with pt today.    Depression Screening: [facility] 04/24/2016 12/14/2013  Decreased Interest 0 0  Down, Depressed, Hopeless 0 0  PHQ - 2 Score 0 0    Advanced Care Planning: N, packet given with explantation.   Bone mass measurements Date of Study: 2015, resulted osteopenia. Pt has low vit d, intemittent 1000 u supplement.   >2 yr interval advised  Cardiovascular Screening Blood Tests Lipid Panel     Component Value Date/Time   CHOL 198 04/24/2016 1147   TRIG 93.0 04/24/2016 1147   HDL 49.90 04/24/2016 1147   CHOLHDL 4 04/24/2016 1147   VLDL 18.6 04/24/2016 1147   LDLCALC 129 (H) 04/24/2016 1147    Diabetes Screening Tests Lab Results  Component Value Date   HGBA1C 6.0 04/24/2016   Info - Screen: BP > 135/80; ([employer] covers 2 screening tests per year for patient diagnosed with pre-diabetes; 1 screening per year if previously tested, but not diagnosed with pre-diabetes or if never tested)  Diabetes Self-Mgmt Training and Medical Nutrition Therapy  Date previously done: None  Info: - initial training up to 10 hours (12 mos), then yearly 2 hours of refresher.    Colorectal Cancer Screening  Last Colonoscopy:By Dr. Kinnie ScalesMedoff, pt states normal results, follow up 10 years.   Screening Pap tests and Pelvic Exam Last Pap and Pelvic exam: 2015 Normal exams. Negative HPV every 5 years.  Hysterectomy: NO, > 65 discussed with pt > 65 with normal pap history she is encouraged to have  pelvic test, but no need for PAP test unless issue arises.  Info:  - PAP test up to 66 years of age.   Screening Mammography Last mammogram: 12/2013, [facility] 1[facility]  Info:  -every 1-2 years ages 7750-74   AAA/EKG Screening: completed if indicated and medicare welcome visit.   Alcohol abuse screening: completed if indicated  STIs screening: Completed if indicated  Assessment and Plan: Hyperlipidemia, elevated a1c, anemia screen, memory decline on cognitive test today.  Hep C screen Pt provided with advanced directive instructions.  Pt encouraged to update eye exam.  Mammogram and DEXa ordered.  PNA series started tdap script printed.  Flu shot in October  Education and Counseling Provided:  See after visit summary that was printed and given to patient 1. Tobacco abuse counseling 2. Alcohol abuse counseling 3. STIs counseling 4. Referrals made  Referrals/orders made if indicated: 1. IBT (Intensive Behavior Therapy) for Cardiovascular disease 2. IBT for Obesity 3. MNT (Medical Nutrition Therapy)  4. Behavioral Counseling for Alcohol abuse  5. HIBT (High Intensity Behavioral Counseling) to prevent STIs (Sexually Transmitted Infections) 6. [place] for AAA screening - IPPE only 7. EKG screening- IPPE only 8. Low dose CT- lung cancer screen 9. Pelvic/PAP exam 10. PSA/Prostate 11. Screen mammogram 12. HIV screen 13. Hep C screen 14. Glaucoma screen 15. Diabetes screen 16. Colon cancer screen 17. DEXA  Vaccinations Administered if needed: 1. Influenza 2. PPV23/prevnar 13 3. Tdap - print script 4. Shingle's vaccination- print script  Electronically Signed by: Felix Pacini, DO [facility] [place]

## 2016-04-24 NOTE — Patient Instructions (Addendum)
  Mamogram 2015 and Bone density screen order tdap script today prevnar 13 administered today Flu shot October by niuse visit.   Hep C Screen completed  today Take  Daily vit d of 1000 units.   Discuss advanced directives with family, complete and send packet back to us.

## 2016-04-25 LAB — HEPATITIS C ANTIBODY: HCV Ab: NEGATIVE

## 2016-04-26 DIAGNOSIS — R7303 Prediabetes: Secondary | ICD-10-CM | POA: Insufficient documentation

## 2016-04-26 MED ORDER — VITAMIN D (ERGOCALCIFEROL) 1.25 MG (50000 UNIT) PO CAPS: 50000.0000 [IU] | ORAL_CAPSULE | ORAL | 0 refills | Status: DC

## 2016-04-26 NOTE — Telephone Encounter (Signed)
Spoke with patient reviewed lab results and instructions. Patient verbalized understanding. 

## 2016-04-26 NOTE — Telephone Encounter (Signed)
Please call pt: - her labs are good with the follow exceptions: - her a1c is 6.0, this is a prediabetic range and will need to be followed every 6 months to monitor to ensure she is not progressing to diabetes. Increase in exercise and lower sugar/carb diet is recommended.  - her vit d is rather low at 20. I have called in a weekly supplement for 8 weeks, and then she is encouraged to take a daily OTC vit d of 1000u daily.

## 2016-05-17 ENCOUNTER — Ambulatory Visit
Admission: RE | Admit: 2016-05-17 | Discharge: 2016-05-17 | Disposition: A | Discharge status: Home / Self Care | Payer: Medicare Other | Source: Ambulatory Visit | Attending: Family Medicine | Admitting: Family Medicine

## 2016-05-17 DIAGNOSIS — M8580 Other specified disorders of bone density and structure, unspecified site: Secondary | ICD-10-CM

## 2016-05-17 DIAGNOSIS — E2839 Other primary ovarian failure: Secondary | ICD-10-CM

## 2016-05-17 DIAGNOSIS — Z1231 Encounter for screening mammogram for malignant neoplasm of breast: Secondary | ICD-10-CM

## 2016-05-17 DIAGNOSIS — E559 Vitamin D deficiency, unspecified: Secondary | ICD-10-CM

## 2016-05-17 DIAGNOSIS — Z780 Asymptomatic menopausal state: Secondary | ICD-10-CM | POA: Diagnosis not present

## 2016-05-17 DIAGNOSIS — M8589 Other specified disorders of bone density and structure, multiple sites: Secondary | ICD-10-CM | POA: Diagnosis not present

## 2016-05-18 ENCOUNTER — Encounter: Payer: Self-pay | Admitting: *Deleted

## 2016-05-18 ENCOUNTER — Telehealth: Payer: Self-pay | Admitting: Family Medicine

## 2016-05-18 NOTE — Telephone Encounter (Signed)
Number listed has been disconnected sent message in my chart.

## 2016-05-18 NOTE — Telephone Encounter (Signed)
Please call pt: - her bone density scan resulted with osteopenia (bone softening), about the same as it had been in prior scan.  - She should be encouraged to take daily vitamin D supplement of 1000 u and calcium of 1200 mg daily (diet/supplement).  - we could also discuss starting fosamax or like medication in addition, to help stop the progresison of bone softening. If she is interested in this medication can call in once a week dose for her and follow up in 4 weeks.

## 2016-05-30 ENCOUNTER — Ambulatory Visit (INDEPENDENT_AMBULATORY_CARE_PROVIDER_SITE_OTHER): Payer: Medicare Other

## 2016-05-30 DIAGNOSIS — Z23 Encounter for immunization: Secondary | ICD-10-CM | POA: Diagnosis not present

## 2016-07-13 ENCOUNTER — Telehealth: Payer: Self-pay | Admitting: Family Medicine

## 2016-07-13 MED ORDER — SCOPOLAMINE 1 MG/3DAYS TD PT72: 1.0000 | MEDICATED_PATCH | TRANSDERMAL | 0 refills | Status: DC

## 2016-07-13 NOTE — Telephone Encounter (Signed)
I have called in a few scopolamine patches. She should have one in place about 3-4 hours prior to boarding ship. There are potential side effects to this medicine, I advise her to read the package insert when she picks it up. I am not certain if her insurance will cover it.  She also has the option of seeing the ship doctor/medic if she becomes ill.  Hope she has a great cruise.

## 2016-07-13 NOTE — Telephone Encounter (Signed)
unfortunately more information is needed before I can prescribe her anything as all those types of medicines can have side effects. - Has she ever experienced sea sickness before? If so what did she use?  - OTC meds she could also try if she tolerates them are dramamine or benadryl.  - If she has never experienced sea sickness prior, she probably will not need anything and the cruise-lines have medics on board to provide medication if needed.

## 2016-07-13 NOTE — Telephone Encounter (Signed)
Please advise 

## 2016-07-13 NOTE — Telephone Encounter (Signed)
Patient states she has never been on a cruise before and she is afraid of sea sickness to ruin her cruise.  She was hoping to get the patch that goes behind the ear.  Patient states dramamine and benadryl makes her extremely.  Please advise.

## 2016-07-13 NOTE — Telephone Encounter (Signed)
Patient aware.

## 2016-07-13 NOTE — Telephone Encounter (Signed)
Can she get Rx for sea sickness? She is going on a cruise, leaving tomorrow. [facility] it [facility]. Okay to leave detailed message on her phone

## 2016-10-08 ENCOUNTER — Encounter: Payer: Self-pay | Admitting: Family Medicine

## 2016-10-08 ENCOUNTER — Ambulatory Visit (INDEPENDENT_AMBULATORY_CARE_PROVIDER_SITE_OTHER): Payer: Medicare Other | Admitting: Family Medicine

## 2016-10-08 VITALS — BP 131/83 | HR 70 | Temp 97.9°F | Resp 20 | Wt 157.5 lb

## 2016-10-08 DIAGNOSIS — J0110 Acute frontal sinusitis, unspecified: Secondary | ICD-10-CM

## 2016-10-08 MED ORDER — DOXYCYCLINE HYCLATE 100 MG PO TABS: 100.0000 mg | ORAL_TABLET | Freq: Two times a day (BID) | ORAL | 0 refills | Status: DC

## 2016-10-08 MED ORDER — BENZONATATE 100 MG PO CAPS: 200.0000 mg | ORAL_CAPSULE | Freq: Two times a day (BID) | ORAL | 0 refills | Status: DC | PRN

## 2016-10-08 NOTE — Patient Instructions (Signed)

## 2016-10-08 NOTE — Progress Notes (Signed)
Haley Fields , [birth year], 66 y.o., female MRN: [id number] Patient Care Team    Relationship Specialty Notifications Start End  Natalia Leatherwoodenee A Olney Monier, DO PCP - General Family Medicine  06/21/15   Sharrell KuJeffrey Medoff, MD Consulting Physician Gastroenterology  04/24/16    cough CC:  Subjective: Pt presents for an acute OV with complaints of cough of 3-4 days  duration.  Associated symptoms include headache, sinus pressure, congestion, ear pressure, burning cough and fatigue. Pt reports nausea. She denies abd pain, fever, chills.  Pt has tried Mucinex plain  to ease their symptoms.  Pt is UTD with flu shot this season.  No sick contacts.   No Known Allergies Social History  Substance Use Topics  . Smoking status: Former Games developermoker  . Smokeless tobacco: Never Used  . Alcohol use No   Past Medical History:  Diagnosis Date  . Allergy   . Bladder prolapse, female, acquired   . Syncope    pt states due to "inner ear". cardiac & stroke w/u NEG.  . Vitamin D deficiency    Past Surgical History:  Procedure Laterality Date  . LEG SURGERY Left    injury. lateral lower leg, hardware  . TONSILLECTOMY AND ADENOIDECTOMY     Family History  Problem Relation Age of Onset  . Alzheimer's disease Mother   . Hypertension Mother   . Cancer Father     lung, asbestos, Curatormechanic  . Cancer Brother     pancreatic, agent orange  . Glaucoma Paternal Grandfather    Allergies as of 10/08/2016   No Known Allergies     Medication List       Accurate as of 10/08/16  8:54 AM. Always use your most recent med list.          cetirizine 10 MG tablet Commonly known as:  ZYRTEC Take 10 mg by mouth daily.   fluticasone 50 MCG/ACT nasal spray Commonly known as:  FLONASE Place 2 sprays into both nostrils as needed for allergies or rhinitis.   ketoconazole 2 % shampoo Commonly known as:  NIZORAL Apply 1 application topically 2 (two) times a week. Apply 5-10 ml to scalp, lather and let stand for 5 minutes,  rinse- use twice a week for 4-8 weeks.   Vitamin D3 1000 units Caps Take 1,000 Units by mouth daily.       No results found for this or any previous visit (from the past 24 hour(s)). No results found.   ROS: Negative, with the exception of above mentioned in HPI   Objective:  BP 131/83 (BP Location: Right Arm, Patient Position: Sitting, Cuff Size: Normal)   Pulse 70   Temp 97.9 F (36.6 C)   Resp 20   Wt 157 lb 8 oz (71.4 kg)   SpO2 98%   BMI 27.03 kg/m  Body mass index is 27.03 kg/m. Gen: Afebrile. No acute distress. Nontoxic in appearance, well developed, well nourished. Pleasant caucaisan female.  HENT: AT. [place]. Bilateral TM visualized bilateral air fluid levels, fullness. MMM, no oral lesions. Bilateral nares mild erythema, drainage present. Throat without erythema or exudates. Cough and hoarseness present, TTP frontal sinus.  Eyes:Pupils Equal Round Reactive to light, Extraocular movements intact,  Conjunctiva without redness, discharge or icterus. Neck/lymp/endocrine: Supple,no lymphadenopathy CV: RRR  Chest: CTAB, no wheeze or crackles. Good air movement, normal resp effort.  Abd: Soft. NTND. BS present.  Skin: no rashes, purpura or petechiae.  Neuro:  Normal gait. PERLA. EOMi. Alert. Oriented x3  Assessment/Plan: Haley Fields is a 66 y.o. female present for acute OV for  Doxycycline, tessalon perles prescribed.  Rest, hydrate, mucinex DM Restart  flonase  F/U PRN  electronically signed by:  Felix Pacini, DO  [facility] [place]

## 2017-01-23 ENCOUNTER — Encounter: Payer: Self-pay | Admitting: Family Medicine

## 2017-01-23 ENCOUNTER — Ambulatory Visit (INDEPENDENT_AMBULATORY_CARE_PROVIDER_SITE_OTHER): Payer: Medicare Other | Admitting: Family Medicine

## 2017-01-23 VITALS — BP 119/77 | HR 67 | Temp 98.3°F | Resp 20 | Wt 160.2 lb

## 2017-01-23 DIAGNOSIS — S30861A Insect bite (nonvenomous) of abdominal wall, initial encounter: Secondary | ICD-10-CM

## 2017-01-23 DIAGNOSIS — W57XXXA Bitten or stung by nonvenomous insect and other nonvenomous arthropods, initial encounter: Secondary | ICD-10-CM

## 2017-01-23 MED ORDER — DOXYCYCLINE HYCLATE 100 MG PO CAPS: 200.0000 mg | ORAL_CAPSULE | Freq: Once | ORAL | 0 refills | Status: AC

## 2017-01-23 NOTE — Patient Instructions (Signed)
Lyme Disease Lyme disease is an infection that affects many parts of the body, including the skin, joints, and nervous system. It is a bacterial infection that starts from the bite of an infected tick. The infection can spread, and some of the symptoms are similar to the flu. If Lyme disease is not treated, it may cause joint pain, swelling, numbness, problems thinking, fatigue, muscle weakness, and other problems. What are the causes? This condition is caused by bacteria called Borrelia burgdorferi. You can get Lyme disease by being bitten by an infected tick. The tick must be attached to your skin to pass along the infection. Deer often carry infected ticks. What increases the risk? The following factors may make you more likely to develop this condition:  Living in or visiting these areas in the [place]  [place].  The [place].  The [place].  Spending time in wooded or grassy areas.  Being outdoors with exposed skin.  Camping, gardening, hiking, fishing, or hunting outdoors.  Failing to remove a tick from your skin within 3-4 days. What are the signs or symptoms? Symptoms of this condition include:  A round, red rash that surrounds the center of the tick bite. This is the first sign of infection. The center of the rash may be blood colored or have tiny blisters.  Fatigue.  Headache.  Chills and fever.  General achiness.  Joint pain, often in the knees.  Muscle pain.  Swollen lymph glands.  Stiff neck. How is this diagnosed? This condition is diagnosed based on:  Your symptoms and medical history.  A physical exam.  A blood test. How is this treated? The main treatment for this condition is antibiotic medicine, which is usually taken by mouth (orally). The length of treatment depends on how soon after a tick bite you begin taking the medicine. In some cases, treatment is necessary for several weeks. If the infection is severe, antibiotics may  need to be given through an IV tube that is inserted into one of your veins. Follow these instructions at home:  Take your antibiotic medicine as told by your health care provider. Do not stop taking the antibiotic even if you start to feel better.  Ask your health care provider about takinga probiotic in between doses of your antibiotic to help avoid stomach upset or diarrhea.  Check with your health care provider before supplementing your treatment. Many alternative therapies have not been proven and may be harmful to you.  Keep all follow-up visits as told by your health care provider. This is important. How is this prevented? You can become reinfected if you get another tick bite from an infected tick. Take these steps to help prevent an infection:  Cover your skin with light-colored clothing when you are outdoors in the spring and summer months.  Spray clothing and skin with bug spray. The spray should be 20-30% DEET.  Avoid wooded, grassy, and shaded areas.  Remove yard litter, brush, trash, and plants that attract deer and rodents.  Check yourself for ticks when you come indoors.  Wash clothing worn each day.  Check your pets for ticks before they come inside.  If you find a tick:  Remove it with tweezers.  Clean your hands and the bite area with rubbing alcohol or soap and water. Pregnant women should take special care to avoid tick bites because the infection can be passed along to the fetus. Contact a health care provider if:  You have symptoms after   treatment.  You have removed a tick and want to bring it to your health care provider for testing. Get help right away if:  You have an irregular heartbeat.  You have nerve pain.  Your face feels numb. This information is not intended to replace advice given to you by your health care provider. Make sure you discuss any questions you have with your health care provider. Document Released: 11/19/2000 Document  Revised: 04/03/2016 Document Reviewed: 04/03/2016 Elsevier Interactive Patient Education  2017 Elsevier Inc.  

## 2017-01-23 NOTE — Progress Notes (Signed)
Haley Fields , 1950/11/24, 66 y.o., female MRN: [id number] Patient Care Team    Relationship Specialty Notifications Start End  Natalia Leatherwood, DO PCP - General Family Medicine  06/21/15   Sharrell Ku, MD Consulting Physician Gastroenterology  04/24/16     Chief Complaint  Patient presents with  . Insect Bite    tick bite today     Subjective: Pt presents for an OV with complaints of tick exposure of 1 days duration.  Associated symptoms include red raised area at bite site. Patient states she pulled a tick off of her about 1 hour ago. She is uncertain when she would have been exposed to a tick. She brings the insect with her today, she wants to make sure she removed entire tick.     Depression screen [facility] 2/9 04/24/2016 12/14/2013  Decreased Interest 0 0  Down, Depressed, Hopeless 0 0  PHQ - 2 Score 0 0    No Known Allergies Social History  Substance Use Topics  . Smoking status: Former Games developer  . Smokeless tobacco: Never Used  . Alcohol use No   Past Medical History:  Diagnosis Date  . Allergy   . Bladder prolapse, female, acquired   . Syncope    pt states due to "inner ear". cardiac & stroke w/u NEG.  . Vitamin D deficiency    Past Surgical History:  Procedure Laterality Date  . LEG SURGERY Left    injury. lateral lower leg, hardware  . TONSILLECTOMY AND ADENOIDECTOMY     Family History  Problem Relation Age of Onset  . Alzheimer's disease Mother   . Hypertension Mother   . Cancer Father        lung, asbestos, Curator  . Cancer Brother        pancreatic, agent orange  . Glaucoma Paternal Grandfather    Allergies as of 01/23/2017   No Known Allergies     Medication List       Accurate as of 01/23/17  2:53 PM. Always use your most recent med list.          cetirizine 10 MG tablet Commonly known as:  ZYRTEC Take 10 mg by mouth daily.   fluticasone 50 MCG/ACT nasal spray Commonly known as:  FLONASE Place 2 sprays into both nostrils as needed  for allergies or rhinitis.   ketoconazole 2 % shampoo Commonly known as:  NIZORAL Apply 1 application topically 2 (two) times a week. Apply 5-10 ml to scalp, lather and let stand for 5 minutes, rinse- use twice a week for 4-8 weeks.   Vitamin D3 1000 units Caps Take 1,000 Units by mouth daily.       All past medical history, surgical history, allergies, family history, immunizations andmedications were updated in the EMR today and reviewed under the history and medication portions of their EMR.     ROS: Negative, with the exception of above mentioned in HPI   Objective:  BP 119/77 (BP Location: Left Arm, Patient Position: Sitting, Cuff Size: Normal)   Pulse 67   Temp 98.3 F (36.8 C)   Resp 20   Wt 160 lb 4 oz (72.7 kg)   SpO2 98%   BMI 27.51 kg/m  Body mass index is 27.51 kg/m. Gen: Afebrile. No acute distress. Nontoxic in appearance, well developed, well nourished.  Skin: ~2-3 cm red raised area surround insect bite. Visualized with magnification and no insect remnents remain. no rashes, purpura or petechiae.   No exam data  present No results found. No results found for this or any previous visit (from the past 24 hour(s)).  Assessment/Plan: Haley Fields is a 66 y.o. female present for OV for  Tick bite of abdomen, initial encounter - keep area clean and dry. Antihistamine or steroid cream OTC recommended to site.  - Doxy prophylaxis tx 200 mg once.  - F/U PRN   Reviewed expectations re: course of current medical issues.  Discussed self-management of symptoms.  Outlined signs and symptoms indicating need for more acute intervention.  Patient verbalized understanding and all questions were answered.  Patient received an After-Visit Summary.     Note is dictated utilizing voice recognition software. Although note has been proof read prior to signing, occasional typographical errors still can be missed. If any questions arise, please do not hesitate to call  for verification.   electronically signed by:  Felix Pacinienee Kuneff, DO  [facility] [place]

## 2017-02-25 ENCOUNTER — Ambulatory Visit (INDEPENDENT_AMBULATORY_CARE_PROVIDER_SITE_OTHER): Payer: Medicare Other | Admitting: Family Medicine

## 2017-02-25 ENCOUNTER — Encounter: Payer: Self-pay | Admitting: Family Medicine

## 2017-02-25 VITALS — BP 133/84 | HR 66 | Temp 98.0°F | Resp 18 | Wt 159.8 lb

## 2017-02-25 DIAGNOSIS — W57XXXD Bitten or stung by nonvenomous insect and other nonvenomous arthropods, subsequent encounter: Secondary | ICD-10-CM | POA: Diagnosis not present

## 2017-02-25 DIAGNOSIS — S30861D Insect bite (nonvenomous) of abdominal wall, subsequent encounter: Secondary | ICD-10-CM | POA: Diagnosis not present

## 2017-02-25 DIAGNOSIS — R21 Rash and other nonspecific skin eruption: Secondary | ICD-10-CM | POA: Diagnosis not present

## 2017-02-25 MED ORDER — METHYLPREDNISOLONE ACETATE 80 MG/ML IJ SUSP
80.0000 mg | Freq: Once | INTRAMUSCULAR | Status: AC
  Administered 2017-02-25: 80 mg via INTRAMUSCULAR

## 2017-02-25 MED ORDER — FLUOCINOLONE ACETONIDE 0.01 % EX CREA: TOPICAL_CREAM | Freq: Two times a day (BID) | CUTANEOUS | 0 refills | Status: DC

## 2017-02-25 NOTE — Patient Instructions (Addendum)
Prescribed more potent steroid cream, do not use on face.  Steriod shot today.  Start allegra daily.  Labs collected--> if positive will prescribed extended doxy course.  F/U 2 weeks only if not resolved or worsening .

## 2017-02-25 NOTE — Progress Notes (Signed)
Haley Fields , 03/03/1951, 66 y.o., female MRN: [id number] Patient Care Team    Relationship Specialty Notifications Start End  Natalia Leatherwood, DO PCP - General Family Medicine  06/21/15   Sharrell Ku, MD Consulting Physician Gastroenterology  04/24/16     Chief Complaint  Patient presents with  . Rash    face,torso      Subjective: Pt presents for an OV with complaints of rash of One week duration.  Associated symptoms include itchiness. Patient reports the rash originally started on her forehead, this area has slowly improved but is still present. Within a few days it started around her umbilicus and a small of her back. She was exposed to a tick on May 30, treated promptly with 200 mg doxycycline prophylactic dose. She states she's had no issues until this rash started a week ago. The rash is fine, mildly red and raised on her back and abdomen. No redness on her forehead but it is raised. Pt has tried hydrocortisone cream to ease their symptoms. Which is helpful, but then wears off too quickly and she starts itching again. She denies any stays in hotels/motels or other family members with in-home being affected. She denies any known exposures to new products or plant irritants. She denies fever, chills, nausea, vomit, abdominal pain or diarrhea. She does admit to a mild headache that responded to Tylenol. She states she doesn't frequently have headaches.  Depression screen [facility] 2/9 04/24/2016 12/14/2013  Decreased Interest 0 0  Down, Depressed, Hopeless 0 0  PHQ - 2 Score 0 0    No Known Allergies Social History  Substance Use Topics  . Smoking status: Former Games developer  . Smokeless tobacco: Never Used  . Alcohol use No   Past Medical History:  Diagnosis Date  . Allergy   . Bladder prolapse, female, acquired   . Syncope    pt states due to "inner ear". cardiac & stroke w/u NEG.  . Vitamin D deficiency    Past Surgical History:  Procedure Laterality Date  . LEG SURGERY  Left    injury. lateral lower leg, hardware  . TONSILLECTOMY AND ADENOIDECTOMY     Family History  Problem Relation Age of Onset  . Alzheimer's disease Mother   . Hypertension Mother   . Cancer Father        lung, asbestos, Curator  . Cancer Brother        pancreatic, agent orange  . Glaucoma Paternal Grandfather    Allergies as of 02/25/2017   No Known Allergies     Medication List       Accurate as of 02/25/17 10:39 AM. Always use your most recent med list.          cetirizine 10 MG tablet Commonly known as:  ZYRTEC Take 10 mg by mouth daily.   fluticasone 50 MCG/ACT nasal spray Commonly known as:  FLONASE Place 2 sprays into both nostrils as needed for allergies or rhinitis.   Vitamin D3 1000 units Caps Take 1,000 Units by mouth daily.       All past medical history, surgical history, allergies, family history, immunizations andmedications were updated in the EMR today and reviewed under the history and medication portions of their EMR.     ROS: Negative, with the exception of above mentioned in HPI   Objective:  BP 133/84 (BP Location: Left Arm, Patient Position: Sitting, Cuff Size: Normal)   Pulse 66   Temp 98 F (36.7 C)  Resp 18   Wt 159 lb 12 oz (72.5 kg)   SpO2 96%   BMI 27.42 kg/m  Body mass index is 27.42 kg/m. Gen: Afebrile. No acute distress. Nontoxic in appearance, well developed, well nourished.  HENT: AT. [employer].  MMM Eyes:Pupils Equal Round Reactive to light, Extraocular movements intact,  Conjunctiva without redness, discharge or icterus. Skin: Fine red raised rashes surrounding umbilicus, small of back and buttocks. Fine raised rash bilateral sides of forehead, no redness. no purpura or petechiae.  Neuro:  Normal gait. PERLA. EOMi. Alert. Oriented x3 Cranial nerves II through XII intact. Muscle strength 5/5 bilateral U?L extremity.  No exam data present No results found. No results found for this or any previous visit (from the past 24  hour(s)).  Assessment/Plan: Lavone NianCherry Spade is a 66 y.o. female present for OV for  Tick bite of abdomen, subsequent encounter - Given potential Lyme exposure at the end of a, and new rash with intermittent headache agreed after discussion to go ahead and test Lyme titers. If results are concerning for acute exposure, would treat with doxycycline BID 21 days. - Lyme Ab/Western Blot Reflex  Rash - Steroid shot provided today. Patient was intolerant to Indocin taper in the past. Hydrocortisone cream continued over forehead. Prescribed higher potency steroid cream for abdomen and back. Patient was advised never to use this high potency steroid on her face. - Patient encouraged to use Allegra over-the-counter daily for the next few weeks. - Lyme Ab/Western Blot Reflex - fluocinolone (VANOS) 0.01 % cream; Apply topically 2 (two) times daily. Do not use on face  Dispense: 30 g; Refill: 0 - methylPREDNISolone acetate (DEPO-MEDROL) injection 80 mg; Inject 1 mL (80 mg total) into the muscle once. - Follow-up in 2 weeks if not resolved.   Reviewed expectations re: course of current medical issues.  Discussed self-management of symptoms.  Outlined signs and symptoms indicating need for more acute intervention.  Patient verbalized understanding and all questions were answered.  Patient received an After-Visit Summary.   No orders of the defined types were placed in this encounter.  Note is dictated utilizing voice recognition software. Although note has been proof read prior to signing, occasional typographical errors still can be missed. If any questions arise, please do not hesitate to call for verification.   electronically signed by:  Felix Pacinienee Kuneff, DO  [facility] - [place]

## 2017-02-26 ENCOUNTER — Telehealth: Payer: Self-pay | Admitting: Family Medicine

## 2017-02-26 LAB — LYME AB/WESTERN BLOT REFLEX: B burgdorferi Ab IgG+IgM: <0.9 Index (ref <0.90)

## 2017-02-26 NOTE — Telephone Encounter (Signed)
Please call pt:  her lyme titers are negative. No Abx needed.

## 2017-02-26 NOTE — Telephone Encounter (Signed)
Spoke with patient reviewed lab results. 

## 2017-05-30 ENCOUNTER — Ambulatory Visit (INDEPENDENT_AMBULATORY_CARE_PROVIDER_SITE_OTHER): Payer: Medicare Other

## 2017-05-30 DIAGNOSIS — Z23 Encounter for immunization: Secondary | ICD-10-CM

## 2017-07-26 NOTE — Progress Notes (Addendum)
Subjective:   Haley Fields is a 66 y.o. female who presents for Medicare Annual (Subsequent) preventive examination.  Review of Systems:  No ROS.  Medicare Wellness Visit. Additional risk factors are reflected in the social history.  Cardiac Risk Factors include: advanced age (>36men, >64 women);family history of premature cardiovascular disease   Sleep patterns: Sleeps 7-9 hours.  Home Safety/Smoke Alarms: Feels safe in home. Smoke alarms in place.  Living environment; residence and Firearm Safety: Lives with fiance in 2 story home.  Seat Belt Safety/Bike Helmet: Wears seat belt.   Female:   Pap-2015     Mammo-05/17/2016, Negative.        Dexa scan-05/17/2016, Osteopenia.       CCS-Colonoscopy 12/15/2010, pt reports normal. Will obtain report.      Objective:     Vitals: BP 126/72 (BP Location: Left Arm, Patient Position: Sitting, Cuff Size: Normal)   Pulse 71   Ht 5\' 4"  (1.626 m)   Wt 152 lb (68.9 kg)   SpO2 97%   BMI 26.09 kg/m   Body mass index is 26.09 kg/m.  Advanced Directives 07/29/2017  Does Patient Have a Medical Advance Directive? No  Would patient like information on creating a medical advance directive? Yes (MAU/Ambulatory/Procedural Areas - Information given)    Tobacco Social History   Tobacco Use  Smoking Status Former Smoker  Smokeless Tobacco Never Used     Counseling given: Not Answered     Past Medical History:  Diagnosis Date  . Allergy   . Bladder prolapse, female, acquired   . Syncope    pt states due to "inner ear". cardiac & stroke w/u NEG.  . Vitamin D deficiency    Past Surgical History:  Procedure Laterality Date  . LEG SURGERY Left    injury. lateral lower leg, hardware  . TONSILLECTOMY AND ADENOIDECTOMY     Family History  Problem Relation Age of Onset  . Alzheimer's disease Mother   . Hypertension Mother   . Cancer Father        lung, asbestos, Curator  . Cancer Brother        pancreatic, agent orange  .  Glaucoma Paternal Grandfather    Social History   Socioeconomic History  . Marital status: Single    Spouse name: None  . Number of children: 2  . Years of education: None  . Highest education level: None  Social Needs  . Financial resource strain: None  . Food insecurity - worry: None  . Food insecurity - inability: None  . Transportation needs - medical: None  . Transportation needs - non-medical: None  Occupational History  . Occupation: retired    Comment: Charity fundraiser, ICU  Tobacco Use  . Smoking status: Former Games developer  . Smokeless tobacco: Never Used  Substance and Sexual Activity  . Alcohol use: No  . Drug use: No  . Sexual activity: No  Other Topics Concern  . 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 She lives in [place] with her fiance.    Outpatient Encounter Medications as of 07/29/2017  Medication Sig  . Cholecalciferol (VITAMIN D3) 1000 units CAPS Take 1,000 Units by mouth daily.  . fexofenadine (ALLEGRA) 30 MG tablet Take 30 mg by mouth 2 (two) times daily.  . fluticasone (FLONASE) 50 MCG/ACT nasal spray Place 2 sprays into both nostrils as needed for allergies or rhinitis.  . Tdap (BOOSTRIX) 5-2.5-18.5  LF-MCG/0.5 injection Inject 0.5 mLs into the muscle once for 1 dose.  Marland Kitchen. Zoster Vaccine Adjuvanted [facility](SHINGRIX) injection Inject 0.5 mLs into the muscle once for 1 dose.  . [DISCONTINUED] cetirizine (ZYRTEC) 10 MG tablet Take 10 mg by mouth daily.  . [DISCONTINUED] fluocinolone (VANOS) 0.01 % cream Apply topically 2 (two) times daily. Do not use on face   No facility-administered encounter medications on file as of 07/29/2017.     Activities of Daily Living In your present state of health, do you have any difficulty performing the following activities: 07/29/2017  Hearing? N  Vision? N  Difficulty concentrating or making decisions? N  Walking or climbing stairs? N  Dressing or bathing? N    Doing errands, shopping? N  Preparing Food and eating ? N  Using the Toilet? N  In the past six months, have you accidently leaked urine? N  Do you have problems with loss of bowel control? N  Managing your Medications? N  Managing your Finances? N  Housekeeping or managing your Housekeeping? N  Some recent data might be hidden     Patient Care Team: Natalia LeatherwoodKuneff, Renee A, DO as PCP - General (Family Medicine) Sharrell KuMedoff, Jeffrey, MD as Consulting Physician (Gastroenterology)    Assessment:    Physical assessment deferred to PCP.  Exercise Activities and Dietary recommendations Current Exercise Habits: Structured exercise class, Type of exercise: walking, Time (Minutes): 45, Frequency (Times/Week): 3, Weekly Exercise (Minutes/Week): 135, Exercise limited by: None identified   Diet (meal preparation, eat out, water intake, caffeinated beverages, dairy products, fruits and vegetables): Drinks coffee and water.   Eats heart healthy diet.   Goals    . Patient Stated     Maintain current weight.       Fall Risk Fall Risk  07/29/2017 04/24/2016 12/14/2013  Falls in the past year? Yes No No  Number falls in past yr: 1 - -  Injury with Fall? No - -  Risk for fall due to : - - Impaired balance/gait  Risk for fall due to: Comment - - secondary to paresthesia L foot & leg, decreased ability to dorsiflex, causes tripping. Seeing ortho.    Depression Screen PHQ 2/9 Scores 07/29/2017 04/24/2016 12/14/2013  PHQ - 2 Score 0 0 0     Cognitive Function       Ad8 score reviewed for issues:  Issues making decisions: no  Less interest in hobbies / activities: no  Repeats questions, stories (family complaining): no  Trouble using ordinary gadgets (microwave, computer, phone): no  Forgets the month or year: no  Mismanaging finances: no  Remembering appts: no  Daily problems with thinking and/or memory: no Ad8 score is=0     Immunization History  Administered Date(s) Administered   . Influenza, High Dose Seasonal PF 05/30/2016, 05/30/2017  . Influenza,inj,Quad PF,6+ Mos 05/20/2014, 06/21/2015  . Pneumococcal Conjugate-13 04/24/2016  . Pneumococcal Polysaccharide-23 07/29/2017  . Zoster 05/14/2014   Screening Tests Health Maintenance  Topic Date Due  . TETANUS/TDAP  12/14/2016  . MAMMOGRAM  05/17/2018  . COLONOSCOPY  12/14/2020  . INFLUENZA VACCINE  Completed  . DEXA SCAN  Completed  . Hepatitis C Screening  Completed  . PNA vac Low Risk Adult  Completed        Plan:    Shingles vaccine at pharmacy.   Bring a copy of your living will and/or healthcare power of attorney to your next office visit.  Continue doing brain stimulating activities (puzzles, reading, adult coloring books,  staying active) to keep memory sharp.   I have personally reviewed and noted the following in the patient's chart:   . Medical and social history . Use of alcohol, tobacco or illicit drugs  . Current medications and supplements . Functional ability and status . Nutritional status . Physical activity . Advanced directives . List of other physicians . Hospitalizations, surgeries, and ER visits in previous 12 months . Vitals . Screenings to include cognitive, depression, and falls . Referrals and appointments  In addition, I have reviewed and discussed with patient certain preventive protocols, quality metrics, and best practice recommendations. A written personalized care plan for preventive services as well as general preventive health recommendations were provided to patient.     Alysia PennaKimberly R Jazion Atteberry, RN  07/29/2017  PCP Notes: -Shingrix Rx sent to pharmacy -Tdap Rx sent to pharmacy -Prefers mammogram every 2 years.   Medical screening examination/treatment/procedure(s) were performed by non-physician practitioner and as supervising physician I was immediately available for consultation/collaboration.  I agree with above assessment and plan.  Electronically Signed  by: Felix Pacinienee Kuneff, DO [facility]- [place]

## 2017-07-29 ENCOUNTER — Ambulatory Visit (INDEPENDENT_AMBULATORY_CARE_PROVIDER_SITE_OTHER): Payer: Medicare Other

## 2017-07-29 ENCOUNTER — Other Ambulatory Visit: Payer: Self-pay

## 2017-07-29 VITALS — BP 126/72 | HR 71 | Ht 64.0 in | Wt 152.0 lb

## 2017-07-29 DIAGNOSIS — Z23 Encounter for immunization: Secondary | ICD-10-CM

## 2017-07-29 DIAGNOSIS — Z0000 Encounter for general adult medical examination without abnormal findings: Secondary | ICD-10-CM | POA: Diagnosis not present

## 2017-07-29 MED ORDER — ZOSTER VAC RECOMB ADJUVANTED 50 MCG/0.5ML IM SUSR: 0.5000 mL | Freq: Once | INTRAMUSCULAR | 1 refills | Status: AC

## 2017-07-29 MED ORDER — TETANUS-DIPHTH-ACELL PERTUSSIS 5-2.5-18.5 LF-MCG/0.5 IM SUSP: 0.5000 mL | Freq: Once | INTRAMUSCULAR | 0 refills | Status: AC

## 2017-07-29 NOTE — Patient Instructions (Addendum)
Shingles vaccine at pharmacy.   Bring a copy of your living will and/or healthcare power of attorney to your next office visit.  Continue doing brain stimulating activities (puzzles, reading, adult coloring books, staying active) to keep memory sharp.   Fall Prevention in the Home Falls can cause injuries. They can happen to people of all ages. There are many things you can do to make your home safe and to help prevent falls. What can I do on the outside of my home?  Regularly fix the edges of walkways and driveways and fix any cracks.  Remove anything that might make you trip as you walk through a door, such as a raised step or threshold.  Trim any bushes or trees on the path to your home.  Use bright outdoor lighting.  Clear any walking paths of anything that might make someone trip, such as rocks or tools.  Regularly check to see if handrails are loose or broken. Make sure that both sides of any steps have handrails.  Any raised decks and porches should have guardrails on the edges.  Have any leaves, snow, or ice cleared regularly.  Use sand or salt on walking paths during winter.  Clean up any spills in your garage right away. This includes oil or grease spills. What can I do in the bathroom?  Use night lights.  Install grab bars by the toilet and in the tub and shower. Do not use towel bars as grab bars.  Use non-skid mats or decals in the tub or shower.  If you need to sit down in the shower, use a plastic, non-slip stool.  Keep the floor dry. Clean up any water that spills on the floor as soon as it happens.  Remove soap buildup in the tub or shower regularly.  Attach bath mats securely with double-sided non-slip rug tape.  Do not have throw rugs and other things on the floor that can make you trip. What can I do in the bedroom?  Use night lights.  Make sure that you have a light by your bed that is easy to reach.  Do not use any sheets or blankets that are  too big for your bed. They should not hang down onto the floor.  Have a firm chair that has side arms. You can use this for support while you get dressed.  Do not have throw rugs and other things on the floor that can make you trip. What can I do in the kitchen?  Clean up any spills right away.  Avoid walking on wet floors.  Keep items that you use a lot in easy-to-reach places.  If you need to reach something above you, use a strong step stool that has a grab bar.  Keep electrical cords out of the way.  Do not use floor polish or wax that makes floors slippery. If you must use wax, use non-skid floor wax.  Do not have throw rugs and other things on the floor that can make you trip. What can I do with my stairs?  Do not leave any items on the stairs.  Make sure that there are handrails on both sides of the stairs and use them. Fix handrails that are broken or loose. Make sure that handrails are as long as the stairways.  Check any carpeting to make sure that it is firmly attached to the stairs. Fix any carpet that is loose or worn.  Avoid having throw rugs at the top or   or bottom of the stairs. If you do have throw rugs, attach them to the floor with carpet tape.  Make sure that you have a light switch at the top of the stairs and the bottom of the stairs. If you do not have them, ask someone to add them for you. What else can I do to help prevent falls?  Wear shoes that: ? Do not have high heels. ? Have rubber bottoms. ? Are comfortable and fit you well. ? Are closed at the toe. Do not wear sandals.  If you use a stepladder: ? Make sure that it is fully opened. Do not climb a closed stepladder. ? Make sure that both sides of the stepladder are locked into place. ? Ask someone to hold it for you, if possible.  Clearly mark and make sure that you can see: ? Any grab bars or handrails. ? First and last steps. ? Where the edge of each step is.  Use tools that help you  move around (mobility aids) if they are needed. These include: ? Canes. ? Walkers. ? Scooters. ? Crutches.  Turn on the lights when you go into a dark area. Replace any light bulbs as soon as they burn out.  Set up your furniture so you have a clear path. Avoid moving your furniture around.  If any of your floors are uneven, fix them.  If there are any pets around you, be aware of where they are.  Review your medicines with your doctor. Some medicines can make you feel dizzy. This can increase your chance of falling. Ask your doctor what other things that you can do to help prevent falls. This information is not intended to replace advice given to you by your health care provider. Make sure you discuss any questions you have with your health care provider. Document Released: 06/09/2009 Document Revised: 01/19/2016 Document Reviewed: 09/17/2014 Elsevier Interactive Patient Education  2018 [place] Maintenance, Female Adopting a healthy lifestyle and getting preventive care can go a long way to promote health and wellness. Talk with your health care provider about what schedule of regular examinations is right for you. This is a good chance for you to check in with your provider about disease prevention and staying healthy. In between checkups, there are plenty of things you can do on your own. Experts have done a lot of research about which lifestyle changes and preventive measures are most likely to keep you healthy. Ask your health care provider for more information. Weight and diet Eat a healthy diet  Be sure to include plenty of vegetables, fruits, low-fat dairy products, and lean protein.  Do not eat a lot of foods high in solid fats, added sugars, or salt.  Get regular exercise. This is one of the most important things you can do for your health. ? Most adults should exercise for at least 150 minutes each week. The exercise should increase your heart rate and make  you sweat (moderate-intensity exercise). ? Most adults should also do strengthening exercises at least twice a week. This is in addition to the moderate-intensity exercise.  Maintain a healthy weight  Body mass index (BMI) is a measurement that can be used to identify possible weight problems. It estimates body fat based on height and weight. Your health care provider can help determine your BMI and help you achieve or maintain a healthy weight.  For females 90 years of age and older: ? A BMI below 18.5 is  considered underweight. ? A BMI of 18.5 to 24.9 is normal. ? A BMI of 25 to 29.9 is considered overweight. ? A BMI of 30 and above is considered obese.  Watch levels of cholesterol and blood lipids  You should start having your blood tested for lipids and cholesterol at 67 years of age, then have this test every 5 years.  You may need to have your cholesterol levels checked more often if: ? Your lipid or cholesterol levels are high. ? You are older than 67 years of age. ? You are at high risk for heart disease.  Cancer screening Lung Cancer  Lung cancer screening is recommended for adults 32-16 years old who are at high risk for lung cancer because of a history of smoking.  A yearly low-dose CT scan of the lungs is recommended for people who: ? Currently smoke. ? Have quit within the past 15 years. ? Have at least a 30-pack-year history of smoking. A pack year is smoking an average of one pack of cigarettes a day for 1 year.  Yearly screening should continue until it has been 15 years since you quit.  Yearly screening should stop if you develop a health problem that would prevent you from having lung cancer treatment.  Breast Cancer  Practice breast self-awareness. This means understanding how your breasts normally appear and feel.  It also means doing regular breast self-exams. Let your health care provider know about any changes, no matter how small.  If you are in your  20s or 30s, you should have a clinical breast exam (CBE) by a health care provider every 1-3 years as part of a regular health exam.  If you are 25 or older, have a CBE every year. Also consider having a breast X-ray (mammogram) every year.  If you have a family history of breast cancer, talk to your health care provider about genetic screening.  If you are at high risk for breast cancer, talk to your health care provider about having an MRI and a mammogram every year.  Breast cancer gene (BRCA) assessment is recommended for women who have family members with BRCA-related cancers. BRCA-related cancers include: ? Breast. ? Ovarian. ? Tubal. ? Peritoneal cancers.  Results of the assessment will determine the need for genetic counseling and BRCA1 and BRCA2 testing.  Cervical Cancer Your health care provider may recommend that you be screened regularly for cancer of the pelvic organs (ovaries, uterus, and vagina). This screening involves a pelvic examination, including checking for microscopic changes to the surface of your cervix (Pap test). You may be encouraged to have this screening done every 3 years, beginning at age 50.  For women ages 9-65, health care providers may recommend pelvic exams and Pap testing every 3 years, or they may recommend the Pap and pelvic exam, combined with testing for human papilloma virus (HPV), every 5 years. Some types of HPV increase your risk of cervical cancer. Testing for HPV may also be done on women of any age with unclear Pap test results.  Other health care providers may not recommend any screening for nonpregnant women who are considered low risk for pelvic cancer and who do not have symptoms. Ask your health care provider if a screening pelvic exam is right for you.  If you have had past treatment for cervical cancer or a condition that could lead to cancer, you need Pap tests and screening for cancer for at least 20 years after your treatment. If Pap  tests have been discontinued, your risk factors (such as having a new sexual partner) need to be reassessed to determine if screening should resume. Some women have medical problems that increase the chance of getting cervical cancer. In these cases, your health care provider may recommend more frequent screening and Pap tests.  Colorectal Cancer  This type of cancer can be detected and often prevented.  Routine colorectal cancer screening usually begins at 67 years of age and continues through 67 years of age.  Your health care provider may recommend screening at an earlier age if you have risk factors for colon cancer.  Your health care provider may also recommend using home test kits to check for hidden blood in the stool.  A small camera at the end of a tube can be used to examine your colon directly (sigmoidoscopy or colonoscopy). This is done to check for the earliest forms of colorectal cancer.  Routine screening usually begins at age 65.  Direct examination of the colon should be repeated every 5-10 years through 67 years of age. However, you may need to be screened more often if early forms of precancerous polyps or small growths are found.  Skin Cancer  Check your skin from head to toe regularly.  Tell your health care provider about any new moles or changes in moles, especially if there is a change in a mole's shape or color.  Also tell your health care provider if you have a mole that is larger than the size of a pencil eraser.  Always use sunscreen. Apply sunscreen liberally and repeatedly throughout the day.  Protect yourself by wearing long sleeves, pants, a wide-brimmed hat, and sunglasses whenever you are outside.  Heart disease, diabetes, and high blood pressure  High blood pressure causes heart disease and increases the risk of stroke. High blood pressure is more likely to develop in: ? People who have blood pressure in the high end of the normal range  (130-139/85-89 mm Hg). ? People who are overweight or obese. ? People who are African American.  If you are 87-11 years of age, have your blood pressure checked every 3-5 years. If you are 85 years of age or older, have your blood pressure checked every year. You should have your blood pressure measured twice-once when you are at a hospital or clinic, and once when you are not at a hospital or clinic. Record the average of the two measurements. To check your blood pressure when you are not at a hospital or clinic, you can use: ? An automated blood pressure machine at a pharmacy. ? A home blood pressure monitor.  If you are between 74 years and 89 years old, ask your health care provider if you should take aspirin to prevent strokes.  Have regular diabetes screenings. This involves taking a blood sample to check your fasting blood sugar level. ? If you are at a normal weight and have a low risk for diabetes, have this test once every three years after 67 years of age. ? If you are overweight and have a high risk for diabetes, consider being tested at a younger age or more often. Preventing infection Hepatitis B  If you have a higher risk for hepatitis B, you should be screened for this virus. You are considered at high risk for hepatitis B if: ? You were born in a country where hepatitis B is common. Ask your health care provider which countries are considered high risk. ? Your parents were  born in a high-risk country, and you have not been immunized against hepatitis B (hepatitis B vaccine). ? You have HIV or AIDS. ? You use needles to inject street drugs. ? You live with someone who has hepatitis B. ? You have had sex with someone who has hepatitis B. ? You get hemodialysis treatment. ? You take certain medicines for conditions, including cancer, organ transplantation, and autoimmune conditions.  Hepatitis C  Blood testing is recommended for: ? Everyone born from [birth year] through  1966. ? Anyone with known risk factors for hepatitis C.  Sexually transmitted infections (STIs)  You should be screened for sexually transmitted infections (STIs) including gonorrhea and chlamydia if: ? You are sexually active and are younger than 67 years of age. ? You are older than 67 years of age and your health care provider tells you that you are at risk for this type of infection. ? Your sexual activity has changed since you were last screened and you are at an increased risk for chlamydia or gonorrhea. Ask your health care provider if you are at risk.  If you do not have HIV, but are at risk, it may be recommended that you take a prescription medicine daily to prevent HIV infection. This is called pre-exposure prophylaxis (PrEP). You are considered at risk if: ? You are sexually active and do not regularly use condoms or know the HIV status of your partner(s). ? You take drugs by injection. ? You are sexually active with a partner who has HIV.  Talk with your health care provider about whether you are at high risk of being infected with HIV. If you choose to begin PrEP, you should first be tested for HIV. You should then be tested every 3 months for as long as you are taking PrEP. Pregnancy  If you are premenopausal and you may become pregnant, ask your health care provider about preconception counseling.  If you may become pregnant, take 400 to 800 micrograms (mcg) of folic acid every day.  If you want to prevent pregnancy, talk to your health care provider about birth control (contraception). Osteoporosis and menopause  Osteoporosis is a disease in which the bones lose minerals and strength with aging. This can result in serious bone fractures. Your risk for osteoporosis can be identified using a bone density scan.  If you are 77 years of age or older, or if you are at risk for osteoporosis and fractures, ask your health care provider if you should be screened.  Ask your health  care provider whether you should take a calcium or vitamin D supplement to lower your risk for osteoporosis.  Menopause may have certain physical symptoms and risks.  Hormone replacement therapy may reduce some of these symptoms and risks. Talk to your health care provider about whether hormone replacement therapy is right for you. Follow these instructions at home:  Schedule regular health, dental, and eye exams.  Stay current with your immunizations.  Do not use any tobacco products including cigarettes, chewing tobacco, or electronic cigarettes.  If you are pregnant, do not drink alcohol.  If you are breastfeeding, limit how much and how often you drink alcohol.  Limit alcohol intake to no more than 1 drink per day for nonpregnant women. One drink equals 12 ounces of beer, 5 ounces of wine, or 1 ounces of hard liquor.  Do not use street drugs.  Do not share needles.  Ask your health care provider for help if you need support or  information about quitting drugs.  Tell your health care provider if you often feel depressed.  Tell your health care provider if you have ever been abused or do not feel safe at home. This information is not intended to replace advice given to you by your health care provider. Make sure you discuss any questions you have with your health care provider. Document Released: 02/26/2011 Document Revised: 01/19/2016 Document Reviewed: 05/17/2015 Elsevier Interactive Patient Education  [employer].

## 2018-06-09 ENCOUNTER — Ambulatory Visit (INDEPENDENT_AMBULATORY_CARE_PROVIDER_SITE_OTHER): Payer: Medicare Other

## 2018-06-09 DIAGNOSIS — Z23 Encounter for immunization: Secondary | ICD-10-CM

## 2018-07-28 ENCOUNTER — Ambulatory Visit: Payer: Medicare Other | Admitting: Sports Medicine

## 2018-07-28 ENCOUNTER — Ambulatory Visit: Payer: Self-pay

## 2018-07-28 NOTE — Telephone Encounter (Addendum)
Called patient to explain that Dr Berline Choughigby is not an orthopedic surgeon. Patient stated understanding. She states that she does not need a referral for her insurance. She was wondering if she got seen the would be seen by a surgeon sooner. I explained that Dr [facility], She agreed to call their office and try to schedule an appt immediately. If they cannot scheduled her she will call her primary care office back for a new referral. Patient agreed to cancel todays appointment with Dr Berline Choughigby.

## 2018-07-28 NOTE — Telephone Encounter (Signed)
Incoming call from Patient who states that she was on on a cruise . On November 25,2019 Patient states that she fell on a wet floor.  Sustained a fracture of proximal neck of the humerus  states the  Ships Dr.   Patient states that she does have an immobilizer on. She wears it continuously. The ships Dr. Charline BillsStated that she would need to see an Orthopedic surgeon as soon as she returned to the [place].  Patient states that it has been a week and would like to be evaluated by an orthopedic as soon as possible.  Per Protocol, Patient should see PCP within 4hours.  Related that to patient, Patient states that she would just like to see to see an Orthopedic surgeon and the quickest way.  Recommended Patient go to ED for further evaluation and referral of Orthopedic Surgeon.         Reason for Disposition . [1] Large swelling or bruise around joint (wrist, elbow, shoulder) AND [2] can't move injured arm normally (bend or straighten completely)  Answer Assessment - Initial Assessment Questions 1. MECHANISM: "How did the injury happen?"     *No Answer* 2. ONSET: "When did the injury happen?" (Minutes or hours ago)      *No Answer* 3. LOCATION: "Where is the injury located?"      *No Answer* 4. APPEARANCE of INJURY: "What does the injury look like?"      symetrical 5. SEVERITY: "Can you use the arm normally?"       6. SWELLING or BRUISING: "is there any swelling or bruising?" If so, ask: "How large is it? (e.g., inches, centimeters)      Swelling large hemtoma 7. PAIN: "Is there pain?" If so, ask: "How bad is the pain?"    (Scale 1-10; or mild, moderate, severe)     0 to 5 didn't take narcotic 8. TETANUS: For any breaks in the skin, ask: "When was the last tetanus booster?"     no 9. OTHER SYMPTOMS: "Do you have any other symptoms?"  (e.g., numbness in hand)      Have had numbness 10. PREGNANCY: "Is there any chance you are pregnant?" "When was your last menstrual period?"       *No Answer*  Protocols  used: ARM INJURY-A-AH

## 2018-07-29 ENCOUNTER — Ambulatory Visit (INDEPENDENT_AMBULATORY_CARE_PROVIDER_SITE_OTHER): Payer: Self-pay | Admitting: Orthopaedic Surgery

## 2018-07-30 ENCOUNTER — Other Ambulatory Visit: Payer: Self-pay | Admitting: Orthopedic Surgery

## 2018-07-30 DIAGNOSIS — S42294A Other nondisplaced fracture of upper end of right humerus, initial encounter for closed fracture: Secondary | ICD-10-CM | POA: Diagnosis not present

## 2018-07-30 DIAGNOSIS — M25511 Pain in right shoulder: Secondary | ICD-10-CM

## 2018-08-04 ENCOUNTER — Ambulatory Visit
Admission: RE | Admit: 2018-08-04 | Discharge: 2018-08-04 | Disposition: A | Discharge status: Home / Self Care | Payer: Medicare Other | Source: Ambulatory Visit | Attending: Orthopedic Surgery | Admitting: Orthopedic Surgery

## 2018-08-04 ENCOUNTER — Ambulatory Visit: Payer: Medicare Other

## 2018-08-04 DIAGNOSIS — M25511 Pain in right shoulder: Secondary | ICD-10-CM

## 2018-08-04 DIAGNOSIS — S42211A Unspecified displaced fracture of surgical neck of right humerus, initial encounter for closed fracture: Secondary | ICD-10-CM | POA: Diagnosis not present

## 2018-08-08 DIAGNOSIS — S42294D Other nondisplaced fracture of upper end of right humerus, subsequent encounter for fracture with routine healing: Secondary | ICD-10-CM | POA: Diagnosis not present

## 2018-09-03 DIAGNOSIS — S42294D Other nondisplaced fracture of upper end of right humerus, subsequent encounter for fracture with routine healing: Secondary | ICD-10-CM | POA: Diagnosis not present

## 2018-09-09 DIAGNOSIS — M25611 Stiffness of right shoulder, not elsewhere classified: Secondary | ICD-10-CM | POA: Diagnosis not present

## 2018-09-09 DIAGNOSIS — M25511 Pain in right shoulder: Secondary | ICD-10-CM | POA: Diagnosis not present

## 2018-09-09 DIAGNOSIS — S42294D Other nondisplaced fracture of upper end of right humerus, subsequent encounter for fracture with routine healing: Secondary | ICD-10-CM | POA: Diagnosis not present

## 2018-09-09 DIAGNOSIS — M6281 Muscle weakness (generalized): Secondary | ICD-10-CM | POA: Diagnosis not present

## 2018-09-16 DIAGNOSIS — M6281 Muscle weakness (generalized): Secondary | ICD-10-CM | POA: Diagnosis not present

## 2018-09-16 DIAGNOSIS — S42294D Other nondisplaced fracture of upper end of right humerus, subsequent encounter for fracture with routine healing: Secondary | ICD-10-CM | POA: Diagnosis not present

## 2018-09-16 DIAGNOSIS — M25611 Stiffness of right shoulder, not elsewhere classified: Secondary | ICD-10-CM | POA: Diagnosis not present

## 2018-09-16 DIAGNOSIS — M25511 Pain in right shoulder: Secondary | ICD-10-CM | POA: Diagnosis not present

## 2018-09-19 DIAGNOSIS — S42294D Other nondisplaced fracture of upper end of right humerus, subsequent encounter for fracture with routine healing: Secondary | ICD-10-CM | POA: Diagnosis not present

## 2018-09-19 DIAGNOSIS — M25511 Pain in right shoulder: Secondary | ICD-10-CM | POA: Diagnosis not present

## 2018-09-19 DIAGNOSIS — M6281 Muscle weakness (generalized): Secondary | ICD-10-CM | POA: Diagnosis not present

## 2018-09-19 DIAGNOSIS — M25611 Stiffness of right shoulder, not elsewhere classified: Secondary | ICD-10-CM | POA: Diagnosis not present

## 2018-09-23 DIAGNOSIS — S42294D Other nondisplaced fracture of upper end of right humerus, subsequent encounter for fracture with routine healing: Secondary | ICD-10-CM | POA: Diagnosis not present

## 2018-09-23 DIAGNOSIS — M25611 Stiffness of right shoulder, not elsewhere classified: Secondary | ICD-10-CM | POA: Diagnosis not present

## 2018-09-23 DIAGNOSIS — M25511 Pain in right shoulder: Secondary | ICD-10-CM | POA: Diagnosis not present

## 2018-09-23 DIAGNOSIS — M6281 Muscle weakness (generalized): Secondary | ICD-10-CM | POA: Diagnosis not present

## 2018-10-01 DIAGNOSIS — S42294D Other nondisplaced fracture of upper end of right humerus, subsequent encounter for fracture with routine healing: Secondary | ICD-10-CM | POA: Diagnosis not present

## 2018-10-02 DIAGNOSIS — M25611 Stiffness of right shoulder, not elsewhere classified: Secondary | ICD-10-CM | POA: Diagnosis not present

## 2018-10-02 DIAGNOSIS — M25511 Pain in right shoulder: Secondary | ICD-10-CM | POA: Diagnosis not present

## 2018-10-02 DIAGNOSIS — S42294D Other nondisplaced fracture of upper end of right humerus, subsequent encounter for fracture with routine healing: Secondary | ICD-10-CM | POA: Diagnosis not present

## 2018-10-02 DIAGNOSIS — M6281 Muscle weakness (generalized): Secondary | ICD-10-CM | POA: Diagnosis not present

## 2018-10-06 DIAGNOSIS — M25611 Stiffness of right shoulder, not elsewhere classified: Secondary | ICD-10-CM | POA: Diagnosis not present

## 2018-10-06 DIAGNOSIS — S42294D Other nondisplaced fracture of upper end of right humerus, subsequent encounter for fracture with routine healing: Secondary | ICD-10-CM | POA: Diagnosis not present

## 2018-10-06 DIAGNOSIS — M25511 Pain in right shoulder: Secondary | ICD-10-CM | POA: Diagnosis not present

## 2018-10-06 DIAGNOSIS — M6281 Muscle weakness (generalized): Secondary | ICD-10-CM | POA: Diagnosis not present

## 2018-10-09 DIAGNOSIS — M25511 Pain in right shoulder: Secondary | ICD-10-CM | POA: Diagnosis not present

## 2018-10-09 DIAGNOSIS — M6281 Muscle weakness (generalized): Secondary | ICD-10-CM | POA: Diagnosis not present

## 2018-10-09 DIAGNOSIS — M25611 Stiffness of right shoulder, not elsewhere classified: Secondary | ICD-10-CM | POA: Diagnosis not present

## 2018-10-09 DIAGNOSIS — S42294D Other nondisplaced fracture of upper end of right humerus, subsequent encounter for fracture with routine healing: Secondary | ICD-10-CM | POA: Diagnosis not present

## 2018-10-15 DIAGNOSIS — S42294D Other nondisplaced fracture of upper end of right humerus, subsequent encounter for fracture with routine healing: Secondary | ICD-10-CM | POA: Diagnosis not present

## 2018-10-15 DIAGNOSIS — M6281 Muscle weakness (generalized): Secondary | ICD-10-CM | POA: Diagnosis not present

## 2018-10-15 DIAGNOSIS — M25511 Pain in right shoulder: Secondary | ICD-10-CM | POA: Diagnosis not present

## 2018-10-15 DIAGNOSIS — M25611 Stiffness of right shoulder, not elsewhere classified: Secondary | ICD-10-CM | POA: Diagnosis not present

## 2018-10-20 DIAGNOSIS — M25511 Pain in right shoulder: Secondary | ICD-10-CM | POA: Diagnosis not present

## 2018-10-20 DIAGNOSIS — M25611 Stiffness of right shoulder, not elsewhere classified: Secondary | ICD-10-CM | POA: Diagnosis not present

## 2018-10-20 DIAGNOSIS — S42294D Other nondisplaced fracture of upper end of right humerus, subsequent encounter for fracture with routine healing: Secondary | ICD-10-CM | POA: Diagnosis not present

## 2018-10-20 DIAGNOSIS — M6281 Muscle weakness (generalized): Secondary | ICD-10-CM | POA: Diagnosis not present

## 2018-10-23 DIAGNOSIS — M25611 Stiffness of right shoulder, not elsewhere classified: Secondary | ICD-10-CM | POA: Diagnosis not present

## 2018-10-23 DIAGNOSIS — S42294D Other nondisplaced fracture of upper end of right humerus, subsequent encounter for fracture with routine healing: Secondary | ICD-10-CM | POA: Diagnosis not present

## 2018-10-23 DIAGNOSIS — M25511 Pain in right shoulder: Secondary | ICD-10-CM | POA: Diagnosis not present

## 2018-10-23 DIAGNOSIS — M6281 Muscle weakness (generalized): Secondary | ICD-10-CM | POA: Diagnosis not present

## 2018-10-27 DIAGNOSIS — S42294D Other nondisplaced fracture of upper end of right humerus, subsequent encounter for fracture with routine healing: Secondary | ICD-10-CM | POA: Diagnosis not present

## 2018-10-27 DIAGNOSIS — M6281 Muscle weakness (generalized): Secondary | ICD-10-CM | POA: Diagnosis not present

## 2018-10-27 DIAGNOSIS — M25611 Stiffness of right shoulder, not elsewhere classified: Secondary | ICD-10-CM | POA: Diagnosis not present

## 2018-10-27 DIAGNOSIS — M25511 Pain in right shoulder: Secondary | ICD-10-CM | POA: Diagnosis not present

## 2018-11-03 DIAGNOSIS — S42294D Other nondisplaced fracture of upper end of right humerus, subsequent encounter for fracture with routine healing: Secondary | ICD-10-CM | POA: Diagnosis not present

## 2018-11-06 DIAGNOSIS — M25611 Stiffness of right shoulder, not elsewhere classified: Secondary | ICD-10-CM | POA: Diagnosis not present

## 2018-11-06 DIAGNOSIS — S42294D Other nondisplaced fracture of upper end of right humerus, subsequent encounter for fracture with routine healing: Secondary | ICD-10-CM | POA: Diagnosis not present

## 2018-11-06 DIAGNOSIS — M25511 Pain in right shoulder: Secondary | ICD-10-CM | POA: Diagnosis not present

## 2018-11-06 DIAGNOSIS — M6281 Muscle weakness (generalized): Secondary | ICD-10-CM | POA: Diagnosis not present

## 2019-03-26 ENCOUNTER — Other Ambulatory Visit: Payer: Self-pay

## 2019-06-03 ENCOUNTER — Ambulatory Visit (INDEPENDENT_AMBULATORY_CARE_PROVIDER_SITE_OTHER): Payer: Medicare Other

## 2019-06-03 ENCOUNTER — Other Ambulatory Visit: Payer: Self-pay

## 2019-06-03 DIAGNOSIS — Z23 Encounter for immunization: Secondary | ICD-10-CM

## 2019-08-03 ENCOUNTER — Other Ambulatory Visit: Payer: Self-pay

## 2019-08-03 ENCOUNTER — Ambulatory Visit: Payer: Medicare Other | Admitting: Family Medicine

## 2019-08-03 ENCOUNTER — Encounter: Payer: Self-pay | Admitting: Family Medicine

## 2019-08-03 ENCOUNTER — Ambulatory Visit (INDEPENDENT_AMBULATORY_CARE_PROVIDER_SITE_OTHER): Payer: Medicare Other | Admitting: Family Medicine

## 2019-08-03 VITALS — BP 126/82 | HR 61 | Temp 97.8°F | Resp 16 | Ht 63.75 in | Wt 163.2 lb

## 2019-08-03 DIAGNOSIS — E559 Vitamin D deficiency, unspecified: Secondary | ICD-10-CM

## 2019-08-03 DIAGNOSIS — Z8781 Personal history of (healed) traumatic fracture: Secondary | ICD-10-CM | POA: Diagnosis not present

## 2019-08-03 DIAGNOSIS — E663 Overweight: Secondary | ICD-10-CM

## 2019-08-03 DIAGNOSIS — Z1231 Encounter for screening mammogram for malignant neoplasm of breast: Secondary | ICD-10-CM | POA: Diagnosis not present

## 2019-08-03 DIAGNOSIS — R7303 Prediabetes: Secondary | ICD-10-CM | POA: Diagnosis not present

## 2019-08-03 DIAGNOSIS — M8580 Other specified disorders of bone density and structure, unspecified site: Secondary | ICD-10-CM | POA: Diagnosis not present

## 2019-08-03 DIAGNOSIS — Z0000 Encounter for general adult medical examination without abnormal findings: Secondary | ICD-10-CM | POA: Insufficient documentation

## 2019-08-03 DIAGNOSIS — E2839 Other primary ovarian failure: Secondary | ICD-10-CM

## 2019-08-03 MED ORDER — ZOSTER VAC RECOMB ADJUVANTED 50 MCG/0.5ML IM SUSR: 0.5000 mL | Freq: Once | INTRAMUSCULAR | 1 refills | Status: AC

## 2019-08-03 NOTE — Progress Notes (Signed)
Medicare AWV , subsequent.   Chief Complaint  Patient presents with  . Medicare Wellness    Pt is doing well, no complaints. No living will or advanced directives. Would like information today     Patient Care Team    Relationship Specialty Notifications Start End  Ma Hillock, DO PCP - General Family Medicine  06/21/15   Richmond Campbell, MD Consulting Physician Gastroenterology  04/24/16      History of Present Ilness: Haley Fields, 68 y.o. , female presents today for Medicare wellness visit.   No complaints. She is taking vit d supplement. She had a right humeral fx last year with a low impact fall.    Past medical, surgical, family and social histories reviewed (including experiences with illnesses, hospital stays, operations, injuries, and treatments):  Past Medical History:  Diagnosis Date  . Allergy   . Bladder prolapse, female, acquired   . Syncope    pt states due to "inner ear". cardiac & stroke w/u NEG.  . Vitamin D deficiency    All allergies reviewed No Known Allergies Past Surgical History:  Procedure Laterality Date  . LEG SURGERY Left    injury. lateral lower leg, hardware  . TONSILLECTOMY AND ADENOIDECTOMY     Family History  Problem Relation Age of Onset  . Alzheimer's disease Mother   . Hypertension Mother   . Cancer Father        lung, asbestos, Dealer  . Cancer Brother        pancreatic, agent orange  . 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 She has 2 grown sons, 3 grandsons and 1 granddaughter. She lives in [place] with her fiance.    All medications verified Allergies as of 08/03/2019   No Known Allergies     Medication List       Accurate as of August 03, 2019  2:34 PM. If you have any questions, ask your nurse or doctor.        fexofenadine 30 MG tablet Commonly known as: ALLEGRA Take 30 mg by mouth 2 (two) times daily.   fluticasone 50  MCG/ACT nasal spray Commonly known as: FLONASE Place 2 sprays into both nostrils as needed for allergies or rhinitis.   Vitamin D3 25 MCG (1000 UT) Caps Take 1,000 Units by mouth daily.   Zoster Vaccine Adjuvanted injection Commonly known as: SHINGRIX Inject 0.5 mLs into the muscle once for 1 dose. Rpt dose in 2-6 mos after initial immunization. Started by: Howard Pouch, DO       Health maintenance:  Colonoscopy:11/2010, Dr. Earlean Shawl pt reports 10 yr follow up.  Mammogram (50-74): 04/2016- [facility] center. Ordered today Cervical cancer screening(<65): N/A Immunizations: tdap 2020 UTD, zoster 2015, PNA series completed 2018, influenza UTD 2020. Shingrix printed today Infectious disease screening: hep c completed Dexa: 04/2016, breast center; osteopenia -1.5>> rpt ordered. Fx of humerus- low impact. Vit d def. Estrogen def.  Glaucoma screen: within last year, - good check up. Fhx of glaucoma.   No exam data present Hearing: Whisper test, no barriers identified.   Depression screen [facility] 2/9 08/03/2019 07/29/2017 04/24/2016 12/14/2013  Decreased Interest 0 0 0 0  Down, Depressed, Hopeless 0 0 0 0  PHQ - 2 Score 0 0 0 0   No flowsheet data found.  Cognitive assessment:  Word recall (daisy, blue, church): 3/3  Immunizations: Immunization History  Administered Date(s) Administered  .  Fluad Quad(high Dose 65+) 06/03/2019  . Influenza, High Dose Seasonal PF 05/30/2016, 05/30/2017, 06/09/2018  . Influenza,inj,Quad PF,6+ Mos 05/20/2014, 06/21/2015  . Pneumococcal Conjugate-13 04/24/2016  . Pneumococcal Polysaccharide-23 07/29/2017  . Tdap 06/22/2019  . Zoster 05/14/2014    Exercise: Current Exercise Habits: Home exercise routine, Type of exercise: walking, Time (Minutes): 30, Frequency (Times/Week): 4, Weekly Exercise (Minutes/Week): 120, Intensity: Mild    Diet: Regular  Functional Status Survey: Get up and go test: steady and less than 20 seconds.  Is the patient deaf or have  difficulty hearing?: No Does the patient have difficulty seeing, even when wearing glasses/contacts?: No Does the patient have difficulty concentrating, remembering, or making decisions?: No Does the patient have difficulty walking or climbing stairs?: No Does the patient have difficulty dressing or bathing?: No Does the patient have difficulty doing errands alone such as visiting a doctor's office or shopping?: No Current Exercise Habits: Home exercise routine, Type of exercise: walking, Time (Minutes): 30, Frequency (Times/Week): 4, Weekly Exercise (Minutes/Week): 120, Intensity: Mild   Fall Risk  08/03/2019 03/26/2019 07/29/2017 04/24/2016 12/14/2013  Falls in the past year? 1 (No Data) Yes No No  Comment - Emmi Telephone Survey: data to providers prior to load - - -  Number falls in past yr: 0 (No Data) 1 - -  Comment - Emmi Telephone Survey Actual Response =  - - -  Injury with Fall? 1 - No - -  Risk for fall due to : - - - - Impaired balance/gait  Risk for fall due to: Comment - - - - secondary to paresthesia L foot & leg, decreased ability to dorsiflex, causes tripping. Seeing ortho.  Follow up Falls evaluation completed;Education provided;Falls prevention discussed - - - -    Advanced Care Planning: N> information provided for her today. She will return it to clinic upon completion.   Cardiovascular Screening Blood Tests Lipid Panel     Component Value Date/Time   CHOL 198 04/24/2016 1147   TRIG 93.0 04/24/2016 1147   HDL 49.90 04/24/2016 1147   CHOLHDL 4 04/24/2016 1147   VLDL 18.6 04/24/2016 1147   LDLCALC 129 (H) 04/24/2016 1147    Diabetes Screening Tests Lab Results  Component Value Date   HGBA1C 6.0 04/24/2016    BP 126/82 (BP Location: Left Arm, Patient Position: Sitting, Cuff Size: Normal)   Pulse 61   Temp 97.8 F (36.6 C) (Temporal)   Resp 16   Ht 5' 3.75" (1.619 m)   Wt 163 lb 4 oz (74 kg)   SpO2 99%   BMI 28.24 kg/m    Assessment and Plan: Breast  cancer screening by mammogram - MM 3D SCREEN BREAST BILATERAL; Future  Vitamin D deficiency/Estrogen deficiency/History of arm fracture/ Osteopenia, unspecified location - DG Bone Density; Future - Vitamin D (25 hydroxy)  Prediabetes - diet and exercise.  - CBC w/Diff - Comp Met (CMET) - Hemoglobin A1c  Medicare annual wellness visit, subsequent Colonoscopy:11/2010, Dr. Earlean Shawl pt reports 10 yr follow up.  Mammogram (50-74): 04/2016- Breast center. Ordered today Cervical cancer screening(<65): N/A Immunizations: tdap 2020 UTD, zoster 2015, PNA series completed 2018, influenza UTD 2020. Shingrix printed today Infectious disease screening: hep c completed Dexa: 04/2016, breast center; osteopenia -1.5>> rpt ordered. Fx of humerus- low impact. Vit d def. Estrogen def.  Glaucoma screen: within last year, - good check up. Fhx of glaucoma.  Overweight (BMI 25.0-29.9) - Lipid panel   Orders Placed This Encounter  Procedures  . MM  3D SCREEN BREAST BILATERAL  . DG Bone Density  . CBC w/Diff  . Comp Met (CMET)  . Hemoglobin A1c  . Lipid panel  . Vitamin D (25 hydroxy)    Electronically Signed by: Howard Pouch, DO [place]

## 2019-08-03 NOTE — Patient Instructions (Signed)
Ordered mammogram, bone density>> they will call you.  Printed shingrix.  Work on Systems developer. notarize and return copy for your records.   Haley Fields , Thank you for taking time to come for your Medicare Wellness Visit. I appreciate your ongoing commitment to your health goals. Please review the following plan we discussed and let me know if I can assist you in the future.   These are the goals we discussed: Goals    . Patient Stated     Maintain current weight.        This is a list of the screening recommended for you and due dates:  Health Maintenance  Topic Date Due  . Mammogram  05/17/2018  . Colon Cancer Screening  12/14/2020  . Tetanus Vaccine  06/21/2029  . Flu Shot  Completed  . DEXA scan (bone density measurement)  Completed  .  Hepatitis C: One time screening is recommended by Center for Disease Control  (CDC) for  adults born from [birth year] through 1966.   Completed  . Pneumonia vaccines  Completed

## 2019-08-04 ENCOUNTER — Ambulatory Visit: Payer: Medicare Other | Admitting: Family Medicine

## 2019-08-04 ENCOUNTER — Telehealth: Payer: Self-pay | Admitting: Family Medicine

## 2019-08-04 DIAGNOSIS — E559 Vitamin D deficiency, unspecified: Secondary | ICD-10-CM

## 2019-08-04 LAB — COMPREHENSIVE METABOLIC PANEL
AG Ratio: 1.7 (calc) (ref 1.0–2.5)
ALT: 18 U/L (ref 6–29)
AST: 23 U/L (ref 10–35)
Albumin: 4.2 g/dL (ref 3.6–5.1)
Alkaline phosphatase (APISO): 59 U/L (ref 37–153)
BUN: 19 mg/dL (ref 7–25)
CO2: 26 mmol/L (ref 20–32)
Calcium: 9.4 mg/dL (ref 8.6–10.4)
Chloride: 105 mmol/L (ref 98–110)
Creat: 0.85 mg/dL (ref 0.50–0.99)
Globulin: 2.5 g/dL (calc) (ref 1.9–3.7)
Glucose, Bld: 85 mg/dL (ref 65–99)
Potassium: 4.2 mmol/L (ref 3.5–5.3)
Sodium: 140 mmol/L (ref 135–146)
Total Bilirubin: 0.4 mg/dL (ref 0.2–1.2)
Total Protein: 6.7 g/dL (ref 6.1–8.1)

## 2019-08-04 LAB — VITAMIN D 25 HYDROXY (VIT D DEFICIENCY, FRACTURES): Vit D, 25-Hydroxy: 12 ng/mL — ABNORMAL LOW (ref 30–100)

## 2019-08-04 LAB — CBC WITH DIFFERENTIAL/PLATELET
Absolute Monocytes: 660 cells/uL (ref 200–950)
Basophils Absolute: 39 cells/uL (ref 0–200)
Basophils Relative: 0.7 %
Eosinophils Absolute: 160 cells/uL (ref 15–500)
Eosinophils Relative: 2.9 %
HCT: 40.1 % (ref 35.0–45.0)
Hemoglobin: 13.5 g/dL (ref 11.7–15.5)
Lymphs Abs: 1524 cells/uL (ref 850–3900)
MCH: 31 pg (ref 27.0–33.0)
MCHC: 33.7 g/dL (ref 32.0–36.0)
MCV: 92.2 fL (ref 80.0–100.0)
MPV: 10.6 fL (ref 7.5–12.5)
Monocytes Relative: 12 %
Neutro Abs: 3119 cells/uL (ref 1500–7800)
Neutrophils Relative %: 56.7 %
Platelets: 186 10*3/uL (ref 140–400)
RBC: 4.35 10*6/uL (ref 3.80–5.10)
RDW: 12 % (ref 11.0–15.0)
Total Lymphocyte: 27.7 %
WBC: 5.5 10*3/uL (ref 3.8–10.8)

## 2019-08-04 LAB — HEMOGLOBIN A1C
Hgb A1c MFr Bld: 5.9 % of total Hgb — ABNORMAL HIGH (ref <5.7)
Mean Plasma Glucose: 123 (calc)
eAG (mmol/L): 6.8 (calc)

## 2019-08-04 LAB — LIPID PANEL
Cholesterol: 179 mg/dL (ref <200)
HDL: 57 mg/dL (ref >=50)
LDL Cholesterol (Calc): 101 mg/dL (calc) — ABNORMAL HIGH
Non-HDL Cholesterol (Calc): 122 mg/dL (calc) (ref <130)
Total CHOL/HDL Ratio: 3.1 (calc) (ref <5.0)
Triglycerides: 113 mg/dL (ref <150)

## 2019-08-04 LAB — TSH: TSH: 1.65 mIU/L (ref 0.40–4.50)

## 2019-08-04 MED ORDER — VITAMIN D (ERGOCALCIFEROL) 1.25 MG (50000 UNIT) PO CAPS: 50000.0000 [IU] | ORAL_CAPSULE | ORAL | 0 refills | Status: DC

## 2019-08-04 NOTE — Telephone Encounter (Signed)
Please inform patient the following information: Her labs are all normal, except her Vit D is extremely low. I have called in once weekly high dose vit. D to [facility] - take with food for better absorption. Increase her OTC vit d to 2000u daily (skip OTC dose on the day of prescribed dose). - follow up in 3 mos with provider to recheck levels.  Blood counts, thyroid, liver/kidney and cholesterol all normal.

## 2019-08-04 NOTE — Telephone Encounter (Signed)
Pt was called and given lab results, she verbalized understanding. Scheduled for 3 month F/U.

## 2019-08-06 ENCOUNTER — Ambulatory Visit
Admission: RE | Admit: 2019-08-06 | Discharge: 2019-08-06 | Disposition: A | Discharge status: Home / Self Care | Payer: Medicare Other | Source: Ambulatory Visit | Attending: Family Medicine | Admitting: Family Medicine

## 2019-08-06 ENCOUNTER — Other Ambulatory Visit: Payer: Self-pay

## 2019-08-06 DIAGNOSIS — Z1231 Encounter for screening mammogram for malignant neoplasm of breast: Secondary | ICD-10-CM

## 2019-08-06 DIAGNOSIS — E559 Vitamin D deficiency, unspecified: Secondary | ICD-10-CM

## 2019-08-06 DIAGNOSIS — M8589 Other specified disorders of bone density and structure, multiple sites: Secondary | ICD-10-CM | POA: Diagnosis not present

## 2019-08-06 DIAGNOSIS — M8580 Other specified disorders of bone density and structure, unspecified site: Secondary | ICD-10-CM

## 2019-08-06 DIAGNOSIS — E2839 Other primary ovarian failure: Secondary | ICD-10-CM

## 2019-08-06 DIAGNOSIS — Z8781 Personal history of (healed) traumatic fracture: Secondary | ICD-10-CM

## 2019-08-06 DIAGNOSIS — Z780 Asymptomatic menopausal state: Secondary | ICD-10-CM | POA: Diagnosis not present

## 2019-09-24 ENCOUNTER — Ambulatory Visit: Payer: Medicare Other

## 2019-10-02 DIAGNOSIS — Z23 Encounter for immunization: Secondary | ICD-10-CM | POA: Diagnosis not present

## 2019-10-04 ENCOUNTER — Ambulatory Visit: Payer: Medicare Other

## 2019-10-20 ENCOUNTER — Ambulatory Visit: Payer: Medicare Other

## 2019-10-31 DIAGNOSIS — Z23 Encounter for immunization: Secondary | ICD-10-CM | POA: Diagnosis not present

## 2019-11-04 ENCOUNTER — Encounter: Payer: Self-pay | Admitting: Family Medicine

## 2019-11-04 ENCOUNTER — Ambulatory Visit (INDEPENDENT_AMBULATORY_CARE_PROVIDER_SITE_OTHER): Payer: Medicare Other | Admitting: Family Medicine

## 2019-11-04 ENCOUNTER — Other Ambulatory Visit: Payer: Self-pay

## 2019-11-04 VITALS — BP 129/78 | HR 61 | Temp 97.7°F | Resp 17 | Ht 64.0 in | Wt 163.0 lb

## 2019-11-04 DIAGNOSIS — E559 Vitamin D deficiency, unspecified: Secondary | ICD-10-CM

## 2019-11-04 LAB — VITAMIN D 25 HYDROXY (VIT D DEFICIENCY, FRACTURES): VITD: 39.43 ng/mL (ref 30.00–100.00)

## 2019-11-04 NOTE — Patient Instructions (Signed)
Great to see you today. We will call you with results and discuss further plan if needed.    Vitamin D Deficiency Vitamin D deficiency is when your body does not have enough vitamin D. Vitamin D is important to your body because:  It helps your body use other minerals.  It helps to keep your bones strong and healthy.  It may help to prevent some diseases.  It helps your heart and other muscles work well. Not getting enough vitamin D can make your bones soft. It can also cause other health problems. What are the causes? This condition may be caused by:  Not eating enough foods that contain vitamin D.  Not getting enough sun.  Having diseases that make it hard for your body to absorb vitamin D.  Having a surgery in which a part of the stomach or a part of the small intestine is removed.  Having kidney disease or liver disease. What increases the risk? You are more likely to get this condition if:  You are older.  You do not spend much time outdoors.  You live in a nursing home.  You have had broken bones.  You have weak or thin bones (osteoporosis).  You have a disease or condition that changes how your body absorbs vitamin D.  You have dark skin.  You take certain medicines.  You are overweight or obese. What are the signs or symptoms?  In mild cases, there may not be any symptoms. If the condition is very bad, symptoms may include: ? Bone pain. ? Muscle pain. ? Falling often. ? Broken bones caused by a minor injury. How is this treated? Treatment may include taking supplements as told by your doctor. Your doctor will tell you what dose is best for you. Supplements may include:  Vitamin D.  Calcium. Follow these instructions at home: Eating and drinking   Eat foods that contain vitamin D, such as: ? Dairy products, cereals, or juices with added vitamin D. Check the label. ? Fish, such as salmon or trout. ? Eggs. ? Oysters. ? Mushrooms. The items  listed above may not be a complete list of what you can eat and drink. Contact a dietitian for more options. General instructions  Take medicines and supplements only as told by your doctor.  Get regular, safe exposure to natural sunlight.  Do not use a tanning bed.  Maintain a healthy weight. Lose weight if needed.  Keep all follow-up visits as told by your doctor. This is important. How is this prevented?  You can get vitamin D by: ? Eating foods that naturally contain vitamin D. ? Eating or drinking products that have vitamin D added to them, such as cereals, juices, and milk. ? Taking vitamin D or a multivitamin that contains vitamin D. ? Being in the sun. Your body makes vitamin D when your skin is exposed to sunlight. Your body changes the sunlight into a form of the vitamin that it can use. Contact a doctor if:  Your symptoms do not go away.  You feel sick to your stomach (nauseous).  You throw up (vomit).  You poop less often than normal, or you have trouble pooping (constipation). Summary  Vitamin D deficiency is when your body does not have enough vitamin D.  Vitamin D helps to keep your bones strong and healthy.  This condition is often treated by taking a supplement.  Your doctor will tell you what dose is best for you. This information is  not intended to replace advice given to you by your health care provider. Make sure you discuss any questions you have with your health care provider. Document Revised: 04/21/2018 Document Reviewed: 04/21/2018 Elsevier Patient Education  2020 [employer].

## 2019-11-04 NOTE — Progress Notes (Signed)
This visit occurred during the SARS-CoV-2 public health emergency.  Safety protocols were in place, including screening questions prior to the visit, additional usage of staff PPE, and extensive cleaning of exam room while observing appropriate contact time as indicated for disinfecting solutions.    Haley Fields , [birth year], 69 y.o., female MRN: [id number] Patient Care Team    Relationship Specialty Notifications Start End  Natalia Leatherwood, DO PCP - General Family Medicine  06/21/15   Sharrell Ku, MD Consulting Physician Gastroenterology  04/24/16     Chief Complaint  Patient presents with  . Vitamin D Recheck    Finished once weekly Vit D supplement      Subjective: Pt presents for an OV to follow up on her vitamin d deficiency.  Vit d level 08/03/2019>> 12. She has tolerated and finished (last week) ergocal 50K weekly supplement. She has restarted the vit d 1000u OTC supplement.  She reports she has noticed the vitd 1000u she had taken prior were expired by about a year. She report she did notice an increase in energy and feeling less fatigued.   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 She lives in [place] with her fiance.   Past Medical History:  Diagnosis Date  . Allergy   . Bladder prolapse, female, acquired   . Syncope    pt states due to "inner ear". cardiac & stroke w/u NEG.  . Vitamin D deficiency    Past Surgical History:  Procedure Laterality Date  . LEG SURGERY Left    injury. lateral lower leg, hardware  . TONSILLECTOMY AND ADENOIDECTOMY     Family History  Problem Relation Age of Onset  . Alzheimer's disease Mother   . Hypertension Mother   . Cancer Father        lung,  asbestos, Curator  . Cancer Brother        pancreatic, agent orange  . Glaucoma Paternal Grandfather    Allergies as of 11/04/2019   No Known Allergies     Medication List       Accurate as of November 04, 2019 11:18 AM. If you have any questions, ask your nurse or doctor.        STOP taking these medications   Vitamin D (Ergocalciferol) 1.25 MG (50000 UNIT) Caps capsule Commonly known as: DRISDOL Stopped by: Felix Pacini, DO     TAKE these medications   fexofenadine 30 MG tablet Commonly known as: ALLEGRA Take 30 mg by mouth 2 (two) times daily.   fluticasone 50 MCG/ACT nasal spray Commonly known as: FLONASE Place 2 sprays into both nostrils as needed for allergies or rhinitis.   Vitamin D3 25 MCG (1000 UT) Caps Take 1,000 Units by mouth daily.       All past medical history, surgical history, allergies, family history, immunizations andmedications were updated in the EMR today and reviewed under the history and medication portions of their EMR.     ROS: Negative, with the exception of above mentioned in HPI   Objective:  BP 129/78 (BP Location: Right Arm, Patient Position: Sitting, Cuff Size: Normal)   Pulse 61   Temp 97.7 F (36.5  C) (Temporal)   Resp 17   Ht 5\' 4"  (1.626 m)   Wt 163 lb (73.9 kg)   SpO2 97%   BMI 27.98 kg/m  Body mass index is 27.98 kg/m. Gen: Afebrile. No acute distress. Nontoxic in appearance, well developed, well nourished.  HENT: AT. [place].  Eyes:Pupils Equal Round Reactive to light, Extraocular movements intact,  Conjunctiva without redness, discharge or icterus. CV: RRR  Chest: CTAB, no wheeze or crackles. Neuro:  Normal gait. PERLA. EOMi. Alert. Oriented x3  Psych: Normal affect, dress and demeanor. Normal speech. Normal thought content and judgment.  No exam data present No results found. No results found for this or any previous visit (from the past 24 hour(s)).  Assessment/Plan: Haley Fields is a 69 y.o. female present for  OV for  Vitamin D deficiency Pt will be called and guided on further dosing.  If in normal range today, would advise her to take 2000u daily.  - Vitamin D (25 hydroxy)   Reviewed expectations re: course of current medical issues.  Discussed self-management of symptoms.  Outlined signs and symptoms indicating need for more acute intervention.  Patient verbalized understanding and all questions were answered.  Patient received an After-Visit Summary.    Orders Placed This Encounter  Procedures  . Vitamin D (25 hydroxy)   No orders of the defined types were placed in this encounter.  Referral Orders  No referral(s) requested today     Note is dictated utilizing voice recognition software. Although note has been proof read prior to signing, occasional typographical errors still can be missed. If any questions arise, please do not hesitate to call for verification.   electronically signed by:  Howard Pouch, DO  [place]

## 2020-06-23 ENCOUNTER — Other Ambulatory Visit: Payer: Self-pay

## 2020-06-23 ENCOUNTER — Ambulatory Visit (INDEPENDENT_AMBULATORY_CARE_PROVIDER_SITE_OTHER): Payer: Medicare Other

## 2020-06-23 DIAGNOSIS — Z23 Encounter for immunization: Secondary | ICD-10-CM

## 2020-08-02 NOTE — Progress Notes (Signed)
Subjective:   Eldana Isip is a 69 y.o. female who presents for Medicare Annual (Subsequent) preventive examination.  Review of Systems     Cardiac Risk Factors include: advanced age (>61men, >38 women)     Objective:    Today's Vitals   08/03/20 1027  BP: 132/74  Pulse: (!) 54  Resp: 16  Temp: 98.4 F (36.9 C)  TempSrc: Oral  SpO2: 97%  Weight: 152 lb 12.8 oz (69.3 kg)  Height: 5\' 4"  (1.626 m)   Body mass index is 26.23 kg/m.  Advanced Directives 08/03/2020 08/03/2019 08/03/2019 07/29/2017  Does Patient Have a Medical Advance Directive? No No No No  Does patient want to make changes to medical advance directive? - Yes (MAU/Ambulatory/Procedural Areas - Information given) - -  Would patient like information on creating a medical advance directive? No - Patient declined - - Yes (MAU/Ambulatory/Procedural Areas - Information given)    Current Medications (verified) Outpatient Encounter Medications as of 08/03/2020  Medication Sig  . Cholecalciferol (VITAMIN D3) 1000 units CAPS Take 1,000 Units by mouth daily.  . fexofenadine (ALLEGRA) 30 MG tablet Take 30 mg by mouth 2 (two) times daily.  . fluticasone (FLONASE) 50 MCG/ACT nasal spray Place 2 sprays into both nostrils as needed for allergies or rhinitis.   No facility-administered encounter medications on file as of 08/03/2020.    Allergies (verified) Patient has no known allergies.   History: Past Medical History:  Diagnosis Date  . Allergy   . Bladder prolapse, female, acquired   . Syncope    pt states due to "inner ear". cardiac & stroke w/u NEG.  . Vitamin D deficiency    Past Surgical History:  Procedure Laterality Date  . LEG SURGERY Left    injury. lateral lower leg, hardware  . TONSILLECTOMY AND ADENOIDECTOMY     Family History  Problem Relation Age of Onset  . Alzheimer's disease Mother   . Hypertension Mother   . Cancer Father        lung, asbestos, 14/03/2020  . Cancer Brother         pancreatic, agent orange  . Glaucoma Paternal Grandfather    Social History   Socioeconomic History  . Marital status: Married    Spouse name: Not on file  . Number of children: 2  . Years of education: Not on file  . Highest education level: Not on file  Occupational History  . Occupation: retired    Comment: Curator, ICU  Tobacco Use  . Smoking status: Former Charity fundraiser  . Smokeless tobacco: Never Used  Vaping Use  . Vaping Use: Never used  Substance and Sexual Activity  . Alcohol use: Yes    Comment: occasional  . Drug use: No  . Sexual activity: Never  Other Topics Concern  . Not on file  Social History Narrative   Ms.Duchesne is a retired [employer]. She has 2 grown sons, 3 grandsons and 1 granddaughter. She lives in [place] with her fiance.   Social Determinants of Health   Financial Resource Strain: Low Risk   . Difficulty of Paying Living Expenses: Not hard at all  Food Insecurity: No Food Insecurity  . Worried About [place] in the Last Year: Never true  . Ran Out of Food in the Last Year: Never true  Transportation Needs: No Transportation Needs  . Lack of Transportation (Medical): No  . Lack of Transportation (Non-Medical): No  Physical Activity: Sufficiently Active  . Days of Exercise per  Week: 4 days  . Minutes of Exercise per Session: 50 min  Stress: No Stress Concern Present  . Feeling of Stress : Not at all  Social Connections: Moderately Integrated  . Frequency of Communication with Friends and Family: More than three times a week  . Frequency of Social Gatherings with Friends and Family: Once a week  . Attends Religious Services: More than 4 times per year  . Active Member of Clubs or Organizations: No  . Attends BankerClub or Organization Meetings: Never  . Marital Status: Married    Tobacco Counseling Counseling given: Not Answered   Clinical Intake:  Pre-visit preparation completed: Yes  Pain : No/denies pain     Nutritional Status: BMI  25 -29 Overweight Nutritional Risks: None Diabetes: No  How often do you need to have someone help you when you read instructions, pamphlets, or other written materials from your doctor or pharmacy?: 1 - Never What is the last grade level you completed in school?: 2 yrs of college-Nursing  Diabetic?No  Interpreter Needed?: No  Information entered by :: Thomasenia SalesMartha Tenna Lacko LPN   Activities of Daily Living In your present state of health, do you have any difficulty performing the following activities: 08/03/2020  Hearing? N  Vision? N  Difficulty concentrating or making decisions? N  Walking or climbing stairs? N  Dressing or bathing? N  Doing errands, shopping? N  Preparing Food and eating ? N  Using the Toilet? N  In the past six months, have you accidently leaked urine? N  Do you have problems with loss of bowel control? N  Managing your Medications? N  Managing your Finances? N  Housekeeping or managing your Housekeeping? N  Some recent data might be hidden    Patient Care Team: Natalia LeatherwoodKuneff, Renee A, DO as PCP - General (Family Medicine) Sharrell KuMedoff, Jeffrey, MD as Consulting Physician (Gastroenterology)  Indicate any recent Medical Services you may have received from other than [facility] providers in the past year (date may be approximate).     Assessment:   This is a routine wellness examination for [place].  Hearing/Vision screen  Hearing Screening   125Hz  250Hz  500Hz  1000Hz  2000Hz  3000Hz  4000Hz  6000Hz  8000Hz   Right ear:           Left ear:           Comments: No issues  Vision Screening Comments: Last eye exam-2019  Dietary issues and exercise activities discussed: Current Exercise Habits: Home exercise routine, Type of exercise: walking, Time (Minutes): 50, Frequency (Times/Week): 4, Weekly Exercise (Minutes/Week): 200, Intensity: Mild, Exercise limited by: None identified  Goals    . Patient Stated     Maintain current weight.     . Patient Stated     Increase activity       Depression Screen PHQ 2/9 Scores 08/03/2020 08/03/2019 07/29/2017 04/24/2016 12/14/2013  PHQ - 2 Score 0 0 0 0 0    Fall Risk Fall Risk  08/03/2020 08/03/2019 03/26/2019 07/29/2017 04/24/2016  Falls in the past year? 0 1 (No Data) Yes No  Comment - - Emmi Telephone Survey: data to providers prior to load - -  Number falls in past yr: 0 0 (No Data) 1 -  Comment - - Emmi Telephone Survey Actual Response =  - -  Injury with Fall? 0 1 - No -  Risk for fall due to : - - - - -  Risk for fall due to: Comment - - - - -  Follow up  Falls prevention discussed Falls evaluation completed;Education provided;Falls prevention discussed - - -    FALL RISK PREVENTION PERTAINING TO THE HOME:  Any stairs in or around the home? Yes  If so, are there any without handrails? No  Home free of loose throw rugs in walkways, pet beds, electrical cords, etc? Yes  Adequate lighting in your home to reduce risk of falls? Yes   ASSISTIVE DEVICES UTILIZED TO PREVENT FALLS:  Life alert? No  Use of a cane, walker or w/c? No  Grab bars in the bathroom? No  Shower chair or bench in shower? Yes  Elevated toilet seat or a handicapped toilet? No   TIMED UP AND GO:  Was the test performed? Yes .  Length of time to ambulate 10 feet: 9 sec.   Gait steady and fast without use of assistive device  Cognitive Function:Normal cognitive status assessed by direct observation by this Nurse Health Advisor. No abnormalities found.          Immunizations Immunization History  Administered Date(s) Administered  . Fluad Quad(high Dose 65+) 06/03/2019, 06/23/2020  . Influenza, High Dose Seasonal PF 05/30/2016, 05/30/2017, 06/09/2018  . Influenza,inj,Quad PF,6+ Mos 05/20/2014, 06/21/2015  . Moderna SARS-COVID-2 Vaccination 10/02/2019, 10/31/2019  . Pneumococcal Conjugate-13 04/24/2016  . Pneumococcal Polysaccharide-23 07/29/2017  . Tdap 06/22/2019  . Zoster 05/14/2014    TDAP status: Up to date  Flu Vaccine status: Up  to date  Pneumococcal vaccine status: Up to date  Covid-19 vaccine status: Completed vaccines  Qualifies for Shingles Vaccine? Yes   Zostavax completed Yes   Shingrix Completed?: No.    Education has been provided regarding the importance of this vaccine. Patient has been advised to call insurance company to determine out of pocket expense if they have not yet received this vaccine. Advised may also receive vaccine at local pharmacy or Health Dept. Verbalized acceptance and understanding.  Screening Tests Health Maintenance  Topic Date Due  . COLONOSCOPY  12/14/2020  . MAMMOGRAM  08/05/2021  . DEXA SCAN  08/05/2022  . TETANUS/TDAP  06/21/2029  . INFLUENZA VACCINE  Completed  . COVID-19 Vaccine  Completed  . Hepatitis C Screening  Completed  . PNA vac Low Risk Adult  Completed    Health Maintenance  There are no preventive care reminders to display for this patient.  Colorectal cancer screening: Type of screening: Colonoscopy. Completed 12/15/2010. Repeat every 10 years  Mammogram status: Ordered today. Pt provided with contact info and advised to call to schedule appt.   Bone Density status: Completed 08/06/2019. Results reflect: Bone density results: OSTEOPENIA. Repeat every 2 years.  Lung Cancer Screening: (Low Dose CT Chest recommended if Age 41-80 years, 30 pack-year currently smoking OR have quit w/in 15years.) does not qualify.     Additional Screening:  Hepatitis C Screening:Completed 04/24/2016  Vision Screening: Recommended annual ophthalmology exams for early detection of glaucoma and other disorders of the eye. Is the patient up to date with their annual eye exam?  No  Who is the provider or what is the name of the office in which the patient attends annual eye exams? Unsure of name Patient to schedule her own appt  Dental Screening: Recommended annual dental exams for proper oral hygiene  Community Resource Referral / Chronic Care Management: CRR required  this visit?  No   CCM required this visit?  No      Plan:     I have personally reviewed and noted the following in the patient's chart:   .  Medical and social history . Use of alcohol, tobacco or illicit drugs  . Current medications and supplements . Functional ability and status . Nutritional status . Physical activity . Advanced directives . List of other physicians . Hospitalizations, surgeries, and ER visits in previous 12 months . Vitals . Screenings to include cognitive, depression, and falls . Referrals and appointments  In addition, I have reviewed and discussed with patient certain preventive protocols, quality metrics, and best practice recommendations. A written personalized care plan for preventive services as well as general preventive health recommendations were provided to patient.   Patient to access avs via mychart.   Roanna Raider, LPN   09/02/5535  Nurse Health Advisor  Nurse Notes: None

## 2020-08-03 ENCOUNTER — Other Ambulatory Visit: Payer: Self-pay

## 2020-08-03 ENCOUNTER — Ambulatory Visit (INDEPENDENT_AMBULATORY_CARE_PROVIDER_SITE_OTHER): Payer: Medicare Other

## 2020-08-03 VITALS — BP 132/74 | HR 54 | Temp 98.4°F | Resp 16 | Ht 64.0 in | Wt 152.8 lb

## 2020-08-03 DIAGNOSIS — Z1231 Encounter for screening mammogram for malignant neoplasm of breast: Secondary | ICD-10-CM

## 2020-08-03 DIAGNOSIS — Z0000 Encounter for general adult medical examination without abnormal findings: Secondary | ICD-10-CM

## 2020-08-03 NOTE — Patient Instructions (Signed)
Haley Fields , Thank you for taking time to come for your Medicare Wellness Visit. I appreciate your ongoing commitment to your health goals. Please review the following plan we discussed and let me know if I can assist you in the future.   Screening recommendations/referrals: Colonoscopy: Completed 12/15/2010-Due 12/14/2020 Mammogram: Ordered today. Someone will be calling you to schedule. Bone Density: Completed 08/06/2019-Due-08/05/2022 Recommended yearly ophthalmology/optometry visit for glaucoma screening and checkup Recommended yearly dental visit for hygiene and checkup  Vaccinations: Influenza vaccine: Up to date Pneumococcal vaccine: Completed vaccines Tdap vaccine: Up to date-Due-06/21/2029 Shingles vaccine: Discuss with pharmacy Covid-19:Completed vaccines  Advanced directives: Copy in cart  Conditions/risks identified: See problem list  Next appointment: Follow up in one year for your annual wellness visit 08/09/2021 @ 10:30   Preventive Care 65 Years and Older, Female Preventive care refers to lifestyle choices and visits with your health care provider that can promote health and wellness. What does preventive care include?  A yearly physical exam. This is also called an annual well check.  Dental exams once or twice a year.  Routine eye exams. Ask your health care provider how often you should have your eyes checked.  Personal lifestyle choices, including:  Daily care of your teeth and gums.  Regular physical activity.  Eating a healthy diet.  Avoiding tobacco and drug use.  Limiting alcohol use.  Practicing safe sex.  Taking low-dose aspirin every day.  Taking vitamin and mineral supplements as recommended by your health care provider. What happens during an annual well check? The services and screenings done by your health care provider during your annual well check will depend on your age, overall health, lifestyle risk factors, and family history of  disease. Counseling  Your health care provider may ask you questions about your:  Alcohol use.  Tobacco use.  Drug use.  Emotional well-being.  Home and relationship well-being.  Sexual activity.  Eating habits.  History of falls.  Memory and ability to understand (cognition).  Work and work Astronomer.  Reproductive health. Screening  You may have the following tests or measurements:  Height, weight, and BMI.  Blood pressure.  Lipid and cholesterol levels. These may be checked every 5 years, or more frequently if you are over 75 years old.  Skin check.  Lung cancer screening. You may have this screening every year starting at age 70 if you have a 30-pack-year history of smoking and currently smoke or have quit within the past 15 years.  Fecal occult blood test (FOBT) of the stool. You may have this test every year starting at age 23.  Flexible sigmoidoscopy or colonoscopy. You may have a sigmoidoscopy every 5 years or a colonoscopy every 10 years starting at age 70.  Hepatitis C blood test.  Hepatitis B blood test.  Sexually transmitted disease (STD) testing.  Diabetes screening. This is done by checking your blood sugar (glucose) after you have not eaten for a while (fasting). You may have this done every 1-3 years.  Bone density scan. This is done to screen for osteoporosis. You may have this done starting at age 70.  Mammogram. This may be done every 1-2 years. Talk to your health care provider about how often you should have regular mammograms. Talk with your health care provider about your test results, treatment options, and if necessary, the need for more tests. Vaccines  Your health care provider may recommend certain vaccines, such as:  Influenza vaccine. This is recommended every year.  Tetanus, diphtheria, and acellular pertussis (Tdap, Td) vaccine. You may need a Td booster every 10 years.  Zoster vaccine. You may need this after age  70.  Pneumococcal 13-valent conjugate (PCV13) vaccine. One dose is recommended after age 70.  Pneumococcal polysaccharide (PPSV23) vaccine. One dose is recommended after age 70. Talk to your health care provider about which screenings and vaccines you need and how often you need them. This information is not intended to replace advice given to you by your health care provider. Make sure you discuss any questions you have with your health care provider. Document Released: 09/09/2015 Document Revised: 05/02/2016 Document Reviewed: 06/14/2015 Elsevier Interactive Patient Education  2017 [employer] Prevention in the Home Falls can cause injuries. They can happen to people of all ages. There are many things you can do to make your home safe and to help prevent falls. What can I do on the outside of my home?  Regularly fix the edges of walkways and driveways and fix any cracks.  Remove anything that might make you trip as you walk through a door, such as a raised step or threshold.  Trim any bushes or trees on the path to your home.  Use bright outdoor lighting.  Clear any walking paths of anything that might make someone trip, such as rocks or tools.  Regularly check to see if handrails are loose or broken. Make sure that both sides of any steps have handrails.  Any raised decks and porches should have guardrails on the edges.  Have any leaves, snow, or ice cleared regularly.  Use sand or salt on walking paths during winter.  Clean up any spills in your garage right away. This includes oil or grease spills. What can I do in the bathroom?  Use night lights.  Install grab bars by the toilet and in the tub and shower. Do not use towel bars as grab bars.  Use non-skid mats or decals in the tub or shower.  If you need to sit down in the shower, use a plastic, non-slip stool.  Keep the floor dry. Clean up any water that spills on the floor as soon as it happens.  Remove  soap buildup in the tub or shower regularly.  Attach bath mats securely with double-sided non-slip rug tape.  Do not have throw rugs and other things on the floor that can make you trip. What can I do in the bedroom?  Use night lights.  Make sure that you have a light by your bed that is easy to reach.  Do not use any sheets or blankets that are too big for your bed. They should not hang down onto the floor.  Have a firm chair that has side arms. You can use this for support while you get dressed.  Do not have throw rugs and other things on the floor that can make you trip. What can I do in the kitchen?  Clean up any spills right away.  Avoid walking on wet floors.  Keep items that you use a lot in easy-to-reach places.  If you need to reach something above you, use a strong step stool that has a grab bar.  Keep electrical cords out of the way.  Do not use floor polish or wax that makes floors slippery. If you must use wax, use non-skid floor wax.  Do not have throw rugs and other things on the floor that can make you trip. What can I do  with my stairs?  Do not leave any items on the stairs.  Make sure that there are handrails on both sides of the stairs and use them. Fix handrails that are broken or loose. Make sure that handrails are as long as the stairways.  Check any carpeting to make sure that it is firmly attached to the stairs. Fix any carpet that is loose or worn.  Avoid having throw rugs at the top or bottom of the stairs. If you do have throw rugs, attach them to the floor with carpet tape.  Make sure that you have a light switch at the top of the stairs and the bottom of the stairs. If you do not have them, ask someone to add them for you. What else can I do to help prevent falls?  Wear shoes that:  Do not have high heels.  Have rubber bottoms.  Are comfortable and fit you well.  Are closed at the toe. Do not wear sandals.  If you use a  stepladder:  Make sure that it is fully opened. Do not climb a closed stepladder.  Make sure that both sides of the stepladder are locked into place.  Ask someone to hold it for you, if possible.  Clearly mark and make sure that you can see:  Any grab bars or handrails.  First and last steps.  Where the edge of each step is.  Use tools that help you move around (mobility aids) if they are needed. These include:  Canes.  Walkers.  Scooters.  Crutches.  Turn on the lights when you go into a dark area. Replace any light bulbs as soon as they burn out.  Set up your furniture so you have a clear path. Avoid moving your furniture around.  If any of your floors are uneven, fix them.  If there are any pets around you, be aware of where they are.  Review your medicines with your doctor. Some medicines can make you feel dizzy. This can increase your chance of falling. Ask your doctor what other things that you can do to help prevent falls. This information is not intended to replace advice given to you by your health care provider. Make sure you discuss any questions you have with your health care provider. Document Released: 06/09/2009 Document Revised: 01/19/2016 Document Reviewed: 09/17/2014 Elsevier Interactive Patient Education  2017 [employer].

## 2021-02-13 ENCOUNTER — Other Ambulatory Visit: Payer: Self-pay

## 2021-02-13 ENCOUNTER — Ambulatory Visit
Admission: RE | Admit: 2021-02-13 | Discharge: 2021-02-13 | Disposition: A | Discharge status: Home / Self Care | Payer: Medicare Other | Source: Ambulatory Visit | Attending: Family Medicine | Admitting: Family Medicine

## 2021-02-13 DIAGNOSIS — Z1231 Encounter for screening mammogram for malignant neoplasm of breast: Secondary | ICD-10-CM | POA: Diagnosis not present

## 2021-03-06 DIAGNOSIS — Z20822 Contact with and (suspected) exposure to covid-19: Secondary | ICD-10-CM | POA: Diagnosis not present

## 2021-03-06 DIAGNOSIS — B349 Viral infection, unspecified: Secondary | ICD-10-CM | POA: Diagnosis not present

## 2021-03-09 ENCOUNTER — Telehealth: Payer: Self-pay

## 2021-03-09 DIAGNOSIS — U071 COVID-19: Secondary | ICD-10-CM | POA: Diagnosis not present

## 2021-03-09 NOTE — Telephone Encounter (Signed)
Spoke with pt and informed her we will help assist her to sched appt. Pt transferred to dana to sched.

## 2021-03-09 NOTE — Telephone Encounter (Signed)
There was only 1 appt available within all of [place] offices. Dr. Neva Seat in [place].  She asked if doctor would retest her.  I told her if she has tested positive, I dont see why he would retest her.  Patient stated "well, it is a home test."     Patient declined and stated she had a call into doctor at [facility] that seen her on Monday, to see if he could do a virtual appt. So he can treat her now that she is positive for COVID.

## 2021-03-09 NOTE — Telephone Encounter (Signed)
Patient states she and her husband was seen at [facility] on Monday 7/11 for COVID symptoms.  (Dr. Francisca December is a primary care / internal medicine doctor ) He tested positive, she did not until today by a HOME TEST.  Patient states her symptoms started Sunday.  She states her head congestion is worse since Sunday. She is requesting meds that husband was started on for COVID.  Please advise  [phone].

## 2021-03-10 ENCOUNTER — Telehealth (INDEPENDENT_AMBULATORY_CARE_PROVIDER_SITE_OTHER): Payer: Medicare Other | Admitting: Family Medicine

## 2021-03-10 ENCOUNTER — Encounter: Payer: Self-pay | Admitting: Family Medicine

## 2021-03-10 DIAGNOSIS — U071 COVID-19: Secondary | ICD-10-CM | POA: Diagnosis not present

## 2021-03-10 MED ORDER — MOLNUPIRAVIR EUA 200MG CAPSULE: 4.0000 | ORAL_CAPSULE | Freq: Two times a day (BID) | ORAL | 0 refills | Status: AC

## 2021-03-10 MED ORDER — PREDNISONE 20 MG PO TABS: 20.0000 mg | ORAL_TABLET | Freq: Every day | ORAL | 0 refills | Status: DC

## 2021-03-10 NOTE — Patient Instructions (Signed)
10 Things You Can Do to Manage Your COVID-19 Symptoms at Home If you have possible or confirmed COVID-19 Stay home except to get medical care. Monitor your symptoms carefully. If your symptoms get worse, call your healthcare provider immediately. Get rest and stay hydrated. If you have a medical appointment, call the healthcare provider ahead of time and tell them that you have or may have COVID-19. For medical emergencies, call 911 and notify the dispatch personnel that you have or may have COVID-19. Cover your cough and sneezes with a tissue or use the inside of your elbow. Wash your hands often with soap and water for at least 20 seconds or clean your hands with an alcohol-based hand sanitizer that contains at least 60% alcohol. As much as possible, stay in a specific room and away from other people in your home. Also, you should use a separate bathroom, if available. If you need to be around other people in or outside of the home, wear a mask. Avoid sharing personal items with other people in your household, like dishes, towels, and bedding. Clean all surfaces that are touched often, like counters, tabletops, and doorknobs. Use household cleaning sprays or wipes according to the label instructions. cdc.gov/coronavirus 03/11/2020 This information is not intended to replace advice given to you by your health care provider. Make sure you discuss any questions you have with your healthcare provider. Document Revised: 09/30/2020 Document Reviewed: 09/30/2020 Elsevier Patient Education  2022 [employer] Inc[employer]  

## 2021-03-10 NOTE — Progress Notes (Signed)
VIRTUAL VISIT VIA VIDEO  I connected with Lavone Nian on 03/10/21 at 10:30 AM EDT by elemedicine application and verified that I am speaking with the correct person using two identifiers. Location patient: Home Location provider: [facility], Office Persons participating in the virtual visit: Patient, Dr. Claiborne Billings and Reece Agar. Cesar, CMA  I discussed the limitations of evaluation and management by telemedicine and the availability of in person appointments. The patient expressed understanding and agreed to proceed.   SUBJECTIVE Chief Complaint  Patient presents with   Cough    Pt c/o nasal congestion, sore throat, dry cough, HA, fever 101, fatigue x 5 days; pt tested pos 7/14 at home; pt had covid exposure at home    MPN:TIRWER Blust is a 70 y.o. female present for covid illness.  Exposure:home. Tested positive yesterday 7/14. Sx start: 4 days ago.  Vaccine:Moderna x2 Pt endorses PND, congestion, sore throat, dry cough, headache, fever 101 F, fatigue.  Pt denies shortness of breath, N,V,D Otc: [place] GFR: >90 Anticoag: n/a  ROS: See pertinent positives and negatives per HPI.  Patient Active Problem List   Diagnosis Date Noted   Medicare annual wellness visit, subsequent 08/03/2019   Prediabetes 04/26/2016   Folliculitis 03/06/2016   Environmental allergies 02/18/2015   Vitamin D deficiency 07/08/2014   Vaginal atrophy 07/08/2014   Osteopenia 03/08/2014   Preventative health care 12/14/2013   Post-menopausal 12/14/2013   Bladder prolapse, female, acquired 12/14/2013    Social History   Tobacco Use   Smoking status: Former   Smokeless tobacco: Never  Substance Use Topics   Alcohol use: Yes    Comment: occasional    Current Outpatient Medications:    Cholecalciferol (VITAMIN D3) 1000 units CAPS, Take 2,000 Units by mouth daily., Disp: , Rfl:    fexofenadine (ALLEGRA) 30 MG tablet, Take 30 mg by mouth 2 (two) times daily., Disp: , Rfl:    fluticasone  (FLONASE) 50 MCG/ACT nasal spray, Place 2 sprays into both nostrils as needed for allergies or rhinitis., Disp: , Rfl:    molnupiravir EUA 200 mg CAPS, Take 4 capsules (800 mg total) by mouth 2 (two) times daily for 5 days., Disp: 40 capsule, Rfl: 0   predniSONE (DELTASONE) 20 MG tablet, Take 1 tablet (20 mg total) by mouth daily with breakfast., Disp: 5 tablet, Rfl: 0   [employer] NASAL SPRAY 0.05 % nasal spray, Place into both nostrils., Disp: , Rfl:    [employer] VITAMIN C 1000 MG tablet, Take 2,000 mg by mouth daily., Disp: , Rfl:    ibuprofen (ADVIL) 400 MG tablet, Take 400 mg by mouth 3 (three) times daily as needed., Disp: , Rfl:    Zinc 50 MG TABS, Take 1 tablet by mouth daily., Disp: , Rfl:   No Known Allergies  OBJECTIVE: There were no vitals taken for this visit. Gen: No acute distress. Nontoxic in appearance.  HENT: AT. [place].  MMM.  Eyes:Pupils Equal Round Reactive to light, Extraocular movements intact,  Conjunctiva without redness, discharge or icterus. Chest: Cough not present on exam. Skin: no rashes, purpura or petechiae.  Neuro:  Normal gait. Alert. Oriented x3  Psych: Normal affect and demeanor. Normal speech. Normal thought content and judgment.  ASSESSMENT AND PLAN: Giani Winther is a 69 y.o. female present for  COVID-19/cough Rest, hydrate.  Nsaids or tylenol for headache/fever +/- flonase, mucinex (DM if cough), nettie pot or nasal saline.  Molnupiravir and prednisone prescribed, take until completed.  Reviewed home care instructions for  COVID. Advised self-isolation at home for at least 5 days. After 5 days, if improved and fever resolved, can be in public, but should wear a mask around others for an additional 5 days. If symptoms, esp, dyspnea develops/worsens, recommend in-person evaluation at either an urgent care or the emergency room.   Felix Pacini, DO 03/10/2021   Return in about 2 weeks (around 03/24/2021), or if symptoms worsen or fail to improve.  No orders of the  defined types were placed in this encounter.  Meds ordered this encounter  Medications   molnupiravir EUA 200 mg CAPS    Sig: Take 4 capsules (800 mg total) by mouth 2 (two) times daily for 5 days.    Dispense:  40 capsule    Refill:  0   predniSONE (DELTASONE) 20 MG tablet    Sig: Take 1 tablet (20 mg total) by mouth daily with breakfast.    Dispense:  5 tablet    Refill:  0   Referral Orders  No referral(s) requested today

## 2021-06-20 ENCOUNTER — Ambulatory Visit (INDEPENDENT_AMBULATORY_CARE_PROVIDER_SITE_OTHER): Payer: Medicare Other

## 2021-06-20 ENCOUNTER — Other Ambulatory Visit: Payer: Self-pay

## 2021-06-20 DIAGNOSIS — Z23 Encounter for immunization: Secondary | ICD-10-CM | POA: Diagnosis not present

## 2021-06-20 NOTE — Progress Notes (Signed)
Per orders of Dr. Claiborne Billings Pt is here for flu vaccine, pt received injection IM in the left deltoid, given by Dominic Pea, CMA. Pt tolerated injection well.

## 2021-08-03 ENCOUNTER — Telehealth: Payer: Self-pay | Admitting: Family Medicine

## 2021-08-04 NOTE — Telephone Encounter (Signed)
LVM for pt to CB regarding results. Pt do not have any labs from this office.

## 2021-08-09 ENCOUNTER — Ambulatory Visit: Payer: Medicare Other

## 2021-08-09 ENCOUNTER — Other Ambulatory Visit: Payer: Self-pay

## 2021-08-09 ENCOUNTER — Ambulatory Visit (INDEPENDENT_AMBULATORY_CARE_PROVIDER_SITE_OTHER): Payer: Medicare Other

## 2021-08-09 DIAGNOSIS — Z0000 Encounter for general adult medical examination without abnormal findings: Secondary | ICD-10-CM

## 2021-08-09 NOTE — Patient Instructions (Signed)
Haley Fields , Thank you for taking time to come for your Medicare Wellness Visit. I appreciate your ongoing commitment to your health goals. Please review the following plan we discussed and let me know if I can assist you in the future.   Screening recommendations/referrals: Colonoscopy: pt will follow back up next year  Mammogram: Done 02/13/21 repeat every year Bone Density: Done 08/06/19 repeat every 3 years  Recommended yearly ophthalmology/optometry visit for glaucoma screening and checkup Recommended yearly dental visit for hygiene and checkup  Vaccinations: Influenza vaccine: Done 06/20/21 repeat every year  Pneumococcal vaccine: Up to date Tdap vaccine: Done 06/22/19 repeat every 10 years  Shingles vaccine: Shingrix discussed. Please contact your pharmacy for coverage information.    Covid-19:Completed 2/5, 10/31/19  Advanced directives: Please bring a copy of your health care power of attorney and living will to the office at your convenience.  Conditions/risks identified: Stay healthy and active   Next appointment: Follow up in one year for your annual wellness visit    Preventive Care 71 Years and Older, Female Preventive care refers to lifestyle choices and visits with your health care provider that can promote health and wellness. What does preventive care include? A yearly physical exam. This is also called an annual well check. Dental exams once or twice a year. Routine eye exams. Ask your health care provider how often you should have your eyes checked. Personal lifestyle choices, including: Daily care of your teeth and gums. Regular physical activity. Eating a healthy diet. Avoiding tobacco and drug use. Limiting alcohol use. Practicing safe sex. Taking low-dose aspirin every day. Taking vitamin and mineral supplements as recommended by your health care provider. What happens during an annual well check? The services and screenings done by your health care  provider during your annual well check will depend on your age, overall health, lifestyle risk factors, and family history of disease. Counseling  Your health care provider may ask you questions about your: Alcohol use. Tobacco use. Drug use. Emotional well-being. Home and relationship well-being. Sexual activity. Eating habits. History of falls. Memory and ability to understand (cognition). Work and work Astronomer. Reproductive health. Screening  You may have the following tests or measurements: Height, weight, and BMI. Blood pressure. Lipid and cholesterol levels. These may be checked every 5 years, or more frequently if you are over 30 years old. Skin check. Lung cancer screening. You may have this screening every year starting at age 6 if you have a 30-pack-year history of smoking and currently smoke or have quit within the past 15 years. Fecal occult blood test (FOBT) of the stool. You may have this test every year starting at age 47. Flexible sigmoidoscopy or colonoscopy. You may have a sigmoidoscopy every 5 years or a colonoscopy every 10 years starting at age 51. Hepatitis C blood test. Hepatitis B blood test. Sexually transmitted disease (STD) testing. Diabetes screening. This is done by checking your blood sugar (glucose) after you have not eaten for a while (fasting). You may have this done every 1-3 years. Bone density scan. This is done to screen for osteoporosis. You may have this done starting at age 67. Mammogram. This may be done every 1-2 years. Talk to your health care provider about how often you should have regular mammograms. Talk with your health care provider about your test results, treatment options, and if necessary, the need for more tests. Vaccines  Your health care provider may recommend certain vaccines, such as: Influenza vaccine. This is  recommended every year. Tetanus, diphtheria, and acellular pertussis (Tdap, Td) vaccine. You may need a Td  booster every 10 years. Zoster vaccine. You may need this after age 59. Pneumococcal 13-valent conjugate (PCV13) vaccine. One dose is recommended after age 1. Pneumococcal polysaccharide (PPSV23) vaccine. One dose is recommended after age 68. Talk to your health care provider about which screenings and vaccines you need and how often you need them. This information is not intended to replace advice given to you by your health care provider. Make sure you discuss any questions you have with your health care provider. Document Released: 09/09/2015 Document Revised: 05/02/2016 Document Reviewed: 06/14/2015 Elsevier Interactive Patient Education  2017 [place] Prevention in the Home Falls can cause injuries. They can happen to people of all ages. There are many things you can do to make your home safe and to help prevent falls. What can I do on the outside of my home? Regularly fix the edges of walkways and driveways and fix any cracks. Remove anything that might make you trip as you walk through a door, such as a raised step or threshold. Trim any bushes or trees on the path to your home. Use bright outdoor lighting. Clear any walking paths of anything that might make someone trip, such as rocks or tools. Regularly check to see if handrails are loose or broken. Make sure that both sides of any steps have handrails. Any raised decks and porches should have guardrails on the edges. Have any leaves, snow, or ice cleared regularly. Use sand or salt on walking paths during winter. Clean up any spills in your garage right away. This includes oil or grease spills. What can I do in the bathroom? Use night lights. Install grab bars by the toilet and in the tub and shower. Do not use towel bars as grab bars. Use non-skid mats or decals in the tub or shower. If you need to sit down in the shower, use a plastic, non-slip stool. Keep the floor dry. Clean up any water that spills on the floor  as soon as it happens. Remove soap buildup in the tub or shower regularly. Attach bath mats securely with double-sided non-slip rug tape. Do not have throw rugs and other things on the floor that can make you trip. What can I do in the bedroom? Use night lights. Make sure that you have a light by your bed that is easy to reach. Do not use any sheets or blankets that are too big for your bed. They should not hang down onto the floor. Have a firm chair that has side arms. You can use this for support while you get dressed. Do not have throw rugs and other things on the floor that can make you trip. What can I do in the kitchen? Clean up any spills right away. Avoid walking on wet floors. Keep items that you use a lot in easy-to-reach places. If you need to reach something above you, use a strong step stool that has a grab bar. Keep electrical cords out of the way. Do not use floor polish or wax that makes floors slippery. If you must use wax, use non-skid floor wax. Do not have throw rugs and other things on the floor that can make you trip. What can I do with my stairs? Do not leave any items on the stairs. Make sure that there are handrails on both sides of the stairs and use them. Fix handrails that are  broken or loose. Make sure that handrails are as long as the stairways. Check any carpeting to make sure that it is firmly attached to the stairs. Fix any carpet that is loose or worn. Avoid having throw rugs at the top or bottom of the stairs. If you do have throw rugs, attach them to the floor with carpet tape. Make sure that you have a light switch at the top of the stairs and the bottom of the stairs. If you do not have them, ask someone to add them for you. What else can I do to help prevent falls? Wear shoes that: Do not have high heels. Have rubber bottoms. Are comfortable and fit you well. Are closed at the toe. Do not wear sandals. If you use a stepladder: Make sure that it is  fully opened. Do not climb a closed stepladder. Make sure that both sides of the stepladder are locked into place. Ask someone to hold it for you, if possible. Clearly mark and make sure that you can see: Any grab bars or handrails. First and last steps. Where the edge of each step is. Use tools that help you move around (mobility aids) if they are needed. These include: Canes. Walkers. Scooters. Crutches. Turn on the lights when you go into a dark area. Replace any light bulbs as soon as they burn out. Set up your furniture so you have a clear path. Avoid moving your furniture around. If any of your floors are uneven, fix them. If there are any pets around you, be aware of where they are. Review your medicines with your doctor. Some medicines can make you feel dizzy. This can increase your chance of falling. Ask your doctor what other things that you can do to help prevent falls. This information is not intended to replace advice given to you by your health care provider. Make sure you discuss any questions you have with your health care provider. Document Released: 06/09/2009 Document Revised: 01/19/2016 Document Reviewed: 09/17/2014 Elsevier Interactive Patient Education  2017 [employer].

## 2021-08-09 NOTE — Progress Notes (Addendum)
Virtual Visit via Telephone Note  I connected with  Haley Fields on 08/09/21 at  3:15 PM EST by telephone and verified that I am speaking with the correct person using two identifiers.  Medicare Annual Wellness visit completed telephonically due to Covid-19 pandemic.   Persons participating in this call: This Health Coach and this patient.   Location: Patient: Home Provider: Office   I discussed the limitations, risks, security and privacy concerns of performing an evaluation and management service by telephone and the availability of in person appointments. The patient expressed understanding and agreed to proceed.  Unable to perform video visit due to video visit attempted and failed and/or patient does not have video capability.   Some vital signs may be absent or patient reported.   Marzella Schlein, LPN   Subjective:   Haley Fields is a 70 y.o. female who presents for Medicare Annual (Subsequent) preventive examination.  Review of Systems     Cardiac Risk Factors include: advanced age (>67men, >70 women)     Objective:    There were no vitals filed for this visit. There is no height or weight on file to calculate BMI.  Advanced Directives 08/09/2021 08/03/2020 08/03/2019 08/03/2019 07/29/2017  Does Patient Have a Medical Advance Directive? Yes No No No No  Does patient want to make changes to medical advance directive? Yes (MAU/Ambulatory/Procedural Areas - Information given) - Yes (MAU/Ambulatory/Procedural Areas - Information given) - -  Would patient like information on creating a medical advance directive? - No - Patient declined - - Yes (MAU/Ambulatory/Procedural Areas - Information given)    Current Medications (verified) Outpatient Encounter Medications as of 08/09/2021  Medication Sig   fexofenadine (ALLEGRA) 30 MG tablet Take 30 mg by mouth 2 (two) times daily.   fluticasone (FLONASE) 50 MCG/ACT nasal spray Place 2 sprays into both nostrils as needed for  allergies or rhinitis.   Cholecalciferol (VITAMIN D3) 1000 units CAPS Take 2,000 Units by mouth daily.   CVS VITAMIN C 1000 MG tablet Take 2,000 mg by mouth daily.   Zinc 50 MG TABS Take 1 tablet by mouth daily.   [DISCONTINUED] CVS NASAL SPRAY 0.05 % nasal spray Place into both nostrils.   [DISCONTINUED] ibuprofen (ADVIL) 400 MG tablet Take 400 mg by mouth 3 (three) times daily as needed. (Patient not taking: Reported on 08/09/2021)   [DISCONTINUED] predniSONE (DELTASONE) 20 MG tablet Take 1 tablet (20 mg total) by mouth daily with breakfast. (Patient not taking: Reported on 08/09/2021)   No facility-administered encounter medications on file as of 08/09/2021.    Allergies (verified) Patient has no known allergies.   History: Past Medical History:  Diagnosis Date   Allergy    Bladder prolapse, female, acquired    Syncope    pt states due to "inner ear". cardiac & stroke w/u NEG.   Vitamin D deficiency    Past Surgical History:  Procedure Laterality Date   LEG SURGERY Left    injury. lateral lower leg, hardware   TONSILLECTOMY AND ADENOIDECTOMY     Family History  Problem Relation Age of Onset   Alzheimer's disease Mother    Hypertension Mother    Cancer Father        lung, asbestos, Curator   Cancer Brother        pancreatic, agent orange   Glaucoma Paternal Grandfather    Social History   Socioeconomic History   Marital status: Married    Spouse name: Not on file   Number  of children: 2   Years of education: Not on file   Highest education level: Not on file  Occupational History   Occupation: retired    Comment: Charity fundraiser, ICU  Tobacco Use   Smoking status: Former   Smokeless tobacco: Never  Building services engineer Use: Never used  Substance and Sexual Activity   Alcohol use: Yes    Comment: occasional   Drug use: No   Sexual activity: Never  Other Topics Concern   Not on file  Social History Narrative   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 She has 2 grown sons, 3  grandsons and 1 granddaughter. She lives in [place] with her fiance.   Social Determinants of Health   Financial Resource Strain: Low Risk    Difficulty of Paying Living Expenses: Not hard at all  Food Insecurity: No Food Insecurity   Worried About Programme researcher, broadcasting/film/video in the Last Year: Never true   Ran Out of Food in the Last Year: Never true  Transportation Needs: No Transportation Needs   Lack of Transportation (Medical): No   Lack of Transportation (Non-Medical): No  Physical Activity: Sufficiently Active   Days of Exercise per Week: 4 days   Minutes of Exercise per Session: 50 min  Stress: No Stress Concern Present   Feeling of Stress : Not at all  Social Connections: Moderately Integrated   Frequency of Communication with Friends and Family: More than three times a week   Frequency of Social Gatherings with Friends and Family: Three times a week   Attends Religious Services: More than 4 times per year   Active Member of Clubs or Organizations: No   Attends Banker Meetings: Never   Marital Status: Married    Tobacco Counseling Counseling given: Not Answered   Clinical Intake:  Pre-visit preparation completed: Yes  Pain : No/denies pain     BMI - recorded: 26.23 Nutritional Status: BMI 25 -29 Overweight Nutritional Risks: None Diabetes: No  How often do you need to have someone help you when you read instructions, pamphlets, or other written materials from your doctor or pharmacy?: 1 - Never  Diabetic?No  Interpreter Needed?: No  Information entered by :: Lanier Ensign, LPN   Activities of Daily Living In your present state of health, do you have any difficulty performing the following activities: 08/09/2021  Hearing? N  Vision? N  Difficulty concentrating or making decisions? N  Walking or climbing stairs? N  Dressing or bathing? N  Doing errands, shopping? N  Preparing Food and eating ? N  Using the Toilet? N  In the past six  months, have you accidently leaked urine? N  Do you have problems with loss of bowel control? N  Managing your Medications? N  Managing your Finances? N  Housekeeping or managing your Housekeeping? N  Some recent data might be hidden    Patient Care Team: Natalia Leatherwood, DO as PCP - General (Family Medicine) Sharrell Ku, MD as Consulting Physician (Gastroenterology)  Indicate any recent Medical Services you may have received from other than [facility] providers in the past year (date may be approximate).     Assessment:   This is a routine wellness examination for Haley Fields.  Hearing/Vision screen Hearing Screening - Comments:: Pt denies any hearing issues  Vision Screening - Comments:: Pt follows up with [facility] for annual eye exams   Dietary issues and exercise activities discussed: Current Exercise Habits: Home exercise routine, Type of exercise: walking,  Time (Minutes): 40, Frequency (Times/Week): 5, Weekly Exercise (Minutes/Week): 200   Goals Addressed             This Visit's Progress    Patient Stated       Stay healthy and active        Depression Screen PHQ 2/9 Scores 08/09/2021 08/03/2020 08/03/2019 07/29/2017 04/24/2016 12/14/2013  PHQ - 2 Score 0 0 0 0 0 0    Fall Risk Fall Risk  08/09/2021 08/03/2020 08/03/2019 03/26/2019 07/29/2017  Falls in the past year? 0 0 1 (No Data) Yes  Comment - - - Emmi Telephone Survey: data to providers prior to load -  Number falls in past yr: 0 0 0 (No Data) 1  Comment - - - Emmi Telephone Survey Actual Response =  -  Injury with Fall? 0 0 1 - No  Risk for fall due to : - - - - -  Risk for fall due to: Comment - - - - -  Follow up Falls prevention discussed Falls prevention discussed Falls evaluation completed;Education provided;Falls prevention discussed - -    FALL RISK PREVENTION PERTAINING TO THE HOME:  Any stairs in or around the home? Yes  If so, are there any without handrails? No  Home free of loose throw rugs in  walkways, pet beds, electrical cords, etc? Yes  Adequate lighting in your home to reduce risk of falls? Yes   ASSISTIVE DEVICES UTILIZED TO PREVENT FALLS:  Life alert? No  Use of a cane, walker or w/c? No  Grab bars in the bathroom? Yes  Shower chair or bench in shower? Yes  Elevated toilet seat or a handicapped toilet? No   TIMED UP AND GO:  Was the test performed? No .  Cognitive Function:     6CIT Screen 08/09/2021  What Year? 0 points  What month? 0 points  What time? 0 points  Count back from 20 0 points  Months in reverse 0 points  Repeat phrase 0 points  Total Score 0    Immunizations Immunization History  Administered Date(s) Administered   Fluad Quad(high Dose 65+) 06/03/2019, 06/23/2020, 06/20/2021   Influenza, High Dose Seasonal PF 05/30/2016, 05/30/2017, 06/09/2018   Influenza,inj,Quad PF,6+ Mos 05/20/2014, 06/21/2015   Moderna Sars-Covid-2 Vaccination 10/02/2019, 10/31/2019   Pneumococcal Conjugate-13 04/24/2016   Pneumococcal Polysaccharide-23 07/29/2017   Tdap 06/22/2019   Zoster, Live 05/14/2014    TDAP status: Up to date  Flu Vaccine status: Up to date  Pneumococcal vaccine status: Up to date  Covid-19 vaccine status: Completed vaccines  Qualifies for Shingles Vaccine? Yes   Zostavax completed No   Shingrix Completed?: No.    Education has been provided regarding the importance of this vaccine. Patient has been advised to call insurance company to determine out of pocket expense if they have not yet received this vaccine. Advised may also receive vaccine at local pharmacy or Health Dept. Verbalized acceptance and understanding.  Screening Tests Health Maintenance  Topic Date Due   Zoster Vaccines- Shingrix (1 of 2) Never done   COVID-19 Vaccine (3 - Booster for Moderna series) 12/26/2019   COLONOSCOPY (Pts 45-30yrs Insurance coverage will need to be confirmed)  08/09/2022 (Originally 12/14/2020)   DEXA SCAN  08/05/2022   MAMMOGRAM  02/14/2023    TETANUS/TDAP  06/21/2029   Pneumonia Vaccine 23+ Years old  Completed   INFLUENZA VACCINE  Completed   Hepatitis C Screening  Completed   HPV VACCINES  Aged Out  Health Maintenance  Health Maintenance Due  Topic Date Due   Zoster Vaccines- Shingrix (1 of 2) Never done   COVID-19 Vaccine (3 - Booster for Moderna series) 12/26/2019    Colorectal cancer screening: Type of screening: Colonoscopy. Completed 12/15/10. Repeat every 10 years pt will follow back up next year   Mammogram status: Completed 02/13/21. Repeat every year  Bone Density status: Completed 08/06/19. Results reflect: Bone density results: OSTEOPENIA. Repeat every 3 years.   Additional Screening:  Hepatitis C Screening:  Completed 04/24/16  Vision Screening: Recommended annual ophthalmology exams for early detection of glaucoma and other disorders of the eye. Is the patient up to date with their annual eye exam?  Yes  Who is the provider or what is the name of the office in which the patient attends annual eye exams? [facility] If pt is not established with a provider, would they like to be referred to a provider to establish care? No .   Dental Screening: Recommended annual dental exams for proper oral hygiene  Community Resource Referral / Chronic Care Management: CRR required this visit?  No   CCM required this visit?  No      Plan:     I have personally reviewed and noted the following in the patients chart:   Medical and social history Use of alcohol, tobacco or illicit drugs  Current medications and supplements including opioid prescriptions.  Functional ability and status Nutritional status Physical activity Advanced directives List of other physicians Hospitalizations, surgeries, and ER visits in previous 12 months Vitals Screenings to include cognitive, depression, and falls Referrals and appointments  In addition, I have reviewed and discussed with patient certain preventive  protocols, quality metrics, and best practice recommendations. A written personalized care plan for preventive services as well as general preventive health recommendations were provided to patient.     Marzella Schlein, LPN   [id number]   Nurse Notes: None

## 2022-01-16 IMAGING — MG MM DIGITAL SCREENING BILAT W/ TOMO AND CAD
8 series · 8 of 24 positions shown · non-contrast
Comparison: Previous exam(s).

CLINICAL DATA: Screening.

EXAM:
DIGITAL SCREENING BILATERAL MAMMOGRAM WITH TOMOSYNTHESIS AND CAD
TECHNIQUE: Bilateral screening digital craniocaudal and mediolateral oblique
mammograms were obtained. Bilateral screening digital breast
tomosynthesis was performed. The images were evaluated with
computer-aided detection.

[L CC synth-2D]
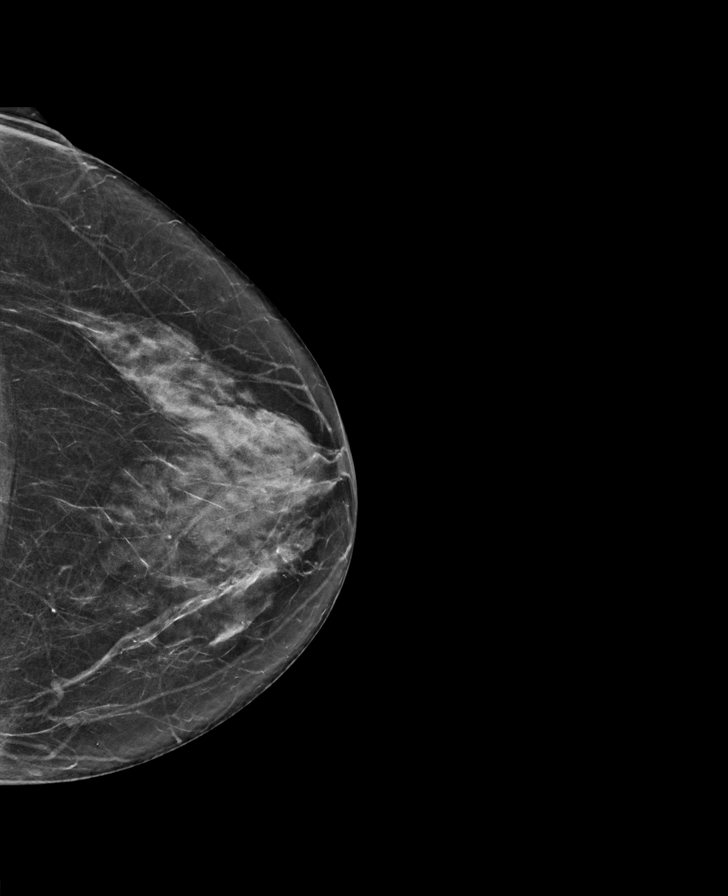

[R MLO synth-2D]
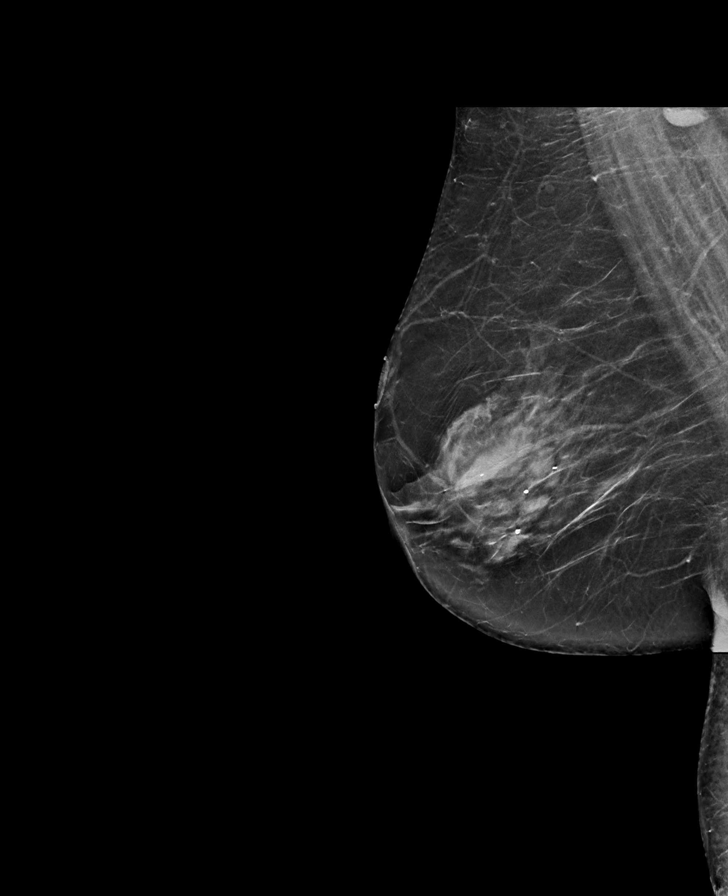

[L MLO synth-2D]
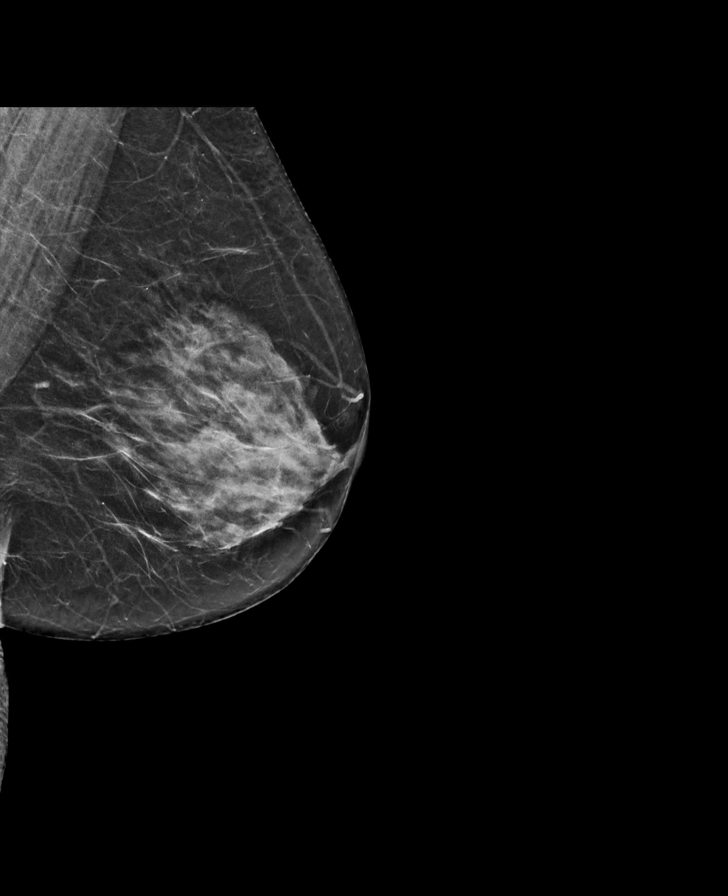

[R CC synth-2D]
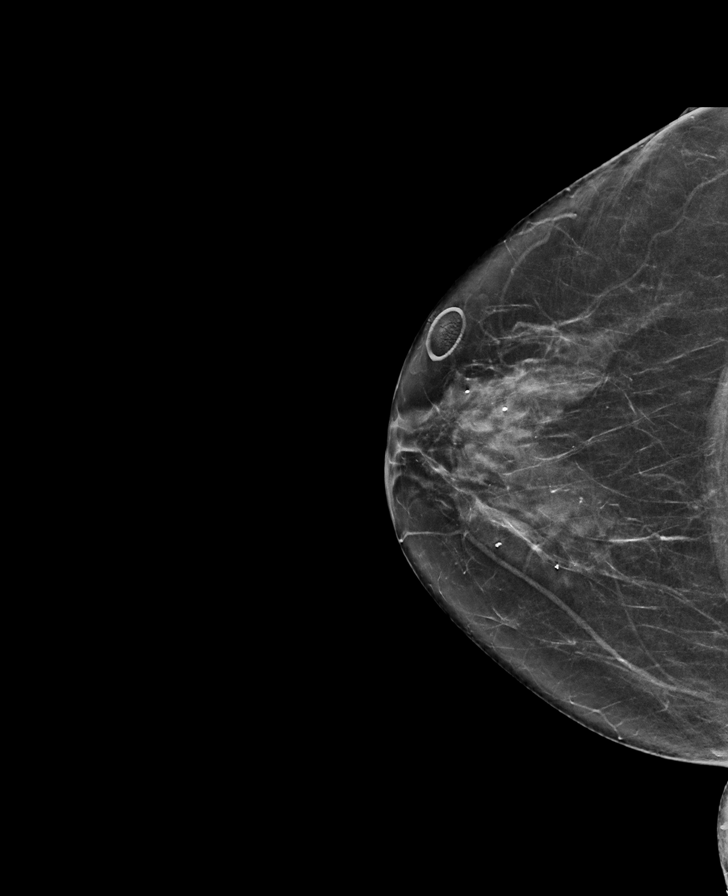

[L MLO tomo · tomo slice 33/64.0]
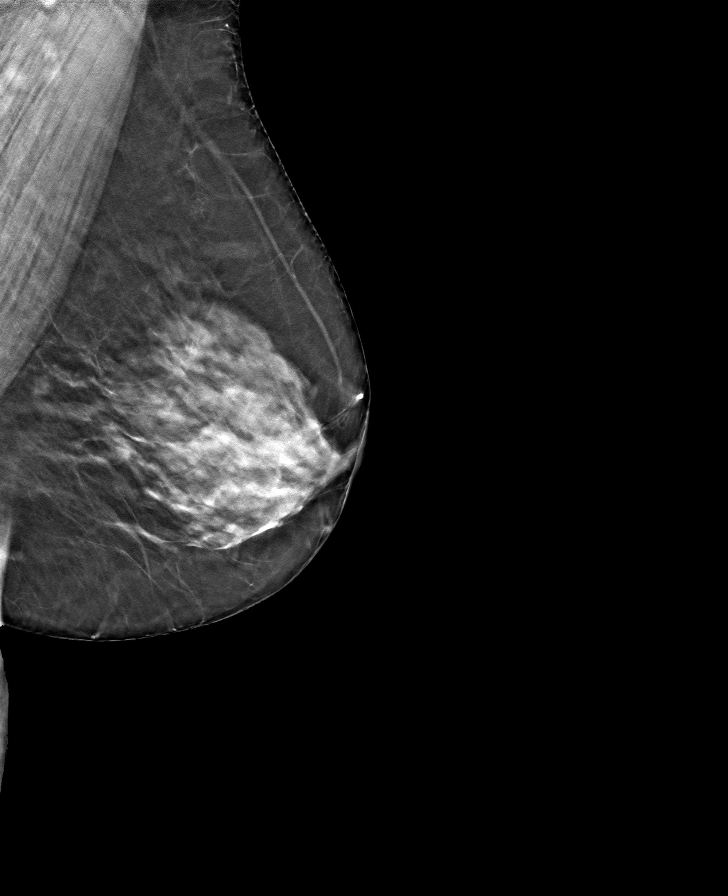

[R CC tomo · tomo slice 33/64.0]
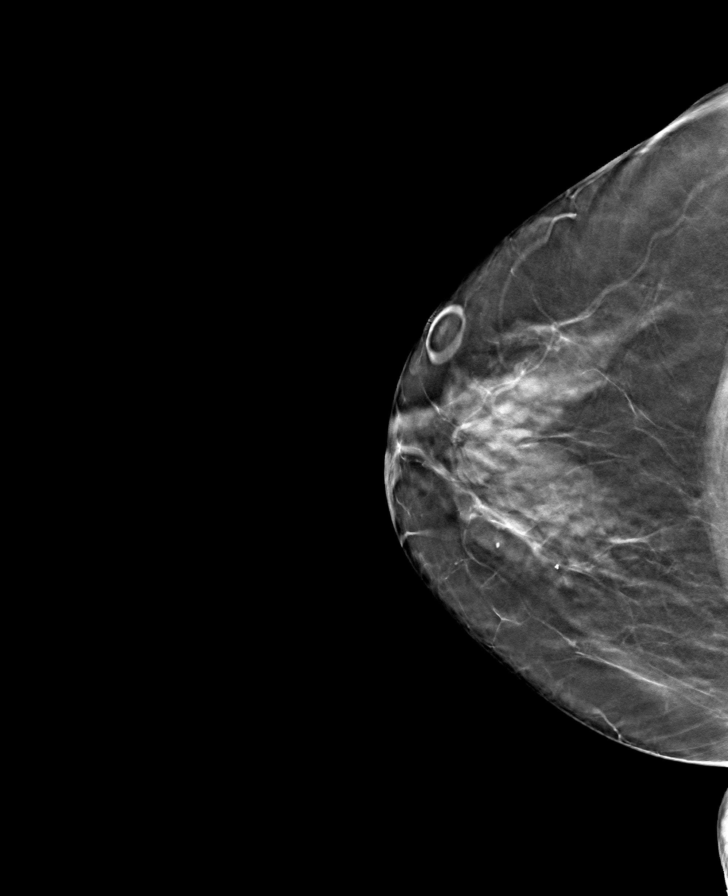

[R MLO tomo · tomo slice 35/69.0]
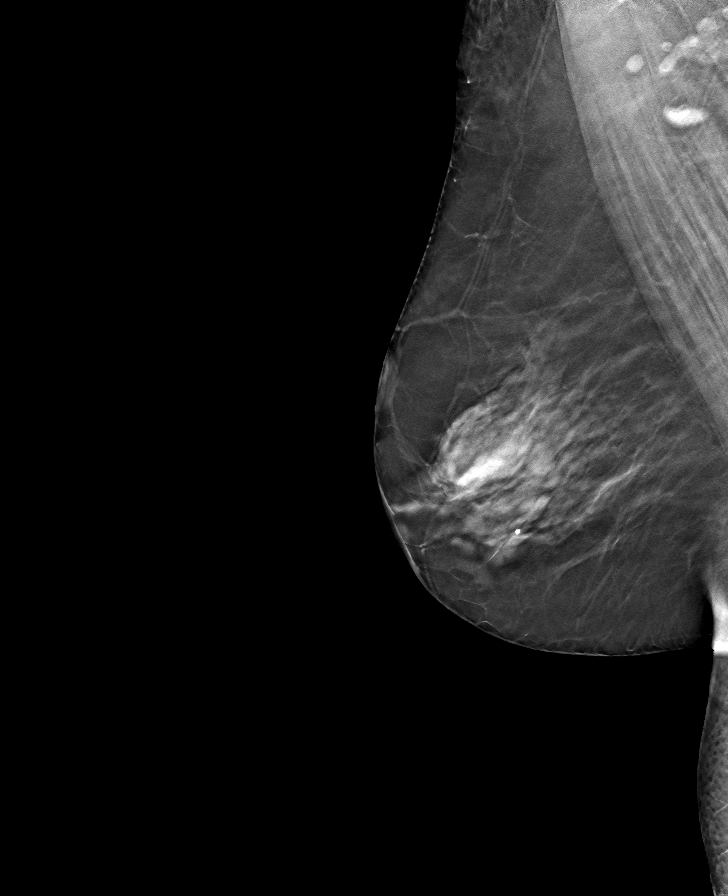

[L CC tomo · tomo slice 32/63.0]
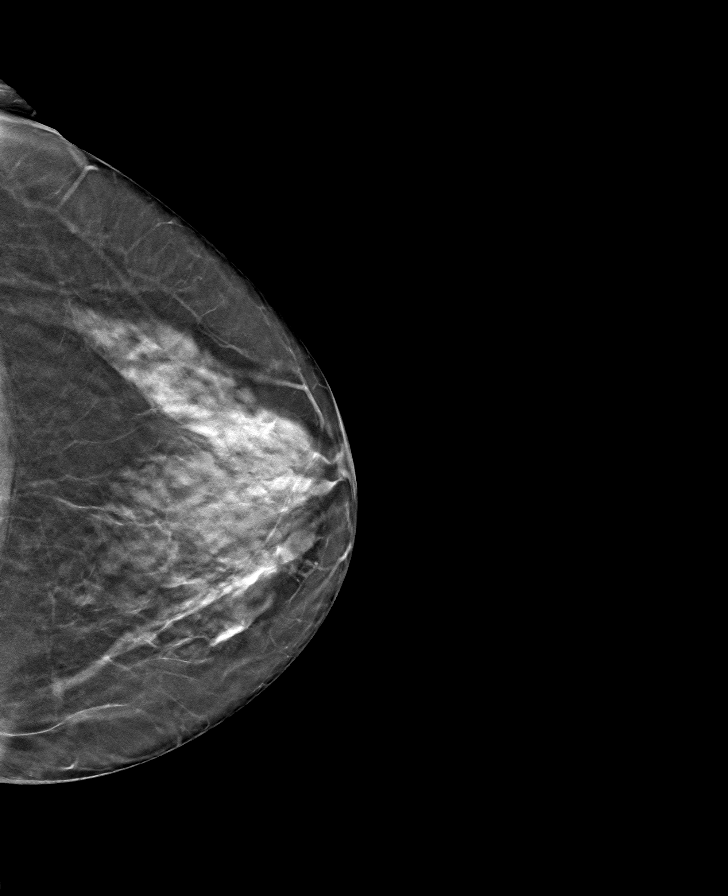

[8 of 24 positions shown; findings below may reference images not displayed]

ACR Breast Density Category c: The breast tissue is heterogeneously
dense, which may obscure small masses.
FINDINGS: There are no findings suspicious for malignancy.
IMPRESSION: No mammographic evidence of malignancy. A result letter of this
screening mammogram will be mailed directly to the patient.

RECOMMENDATION:
Screening mammogram in one year. (Code:Q3-W-BC3)

BI-RADS CATEGORY  1: Negative.

## 2022-04-10 ENCOUNTER — Encounter: Payer: Self-pay | Admitting: Family Medicine

## 2022-04-10 ENCOUNTER — Ambulatory Visit (INDEPENDENT_AMBULATORY_CARE_PROVIDER_SITE_OTHER): Payer: Medicare Other | Admitting: Family Medicine

## 2022-04-10 VITALS — BP 123/81 | HR 83 | Temp 98.3°F | Ht 64.0 in | Wt 147.0 lb

## 2022-04-10 DIAGNOSIS — R0981 Nasal congestion: Secondary | ICD-10-CM | POA: Diagnosis not present

## 2022-04-10 DIAGNOSIS — U071 COVID-19: Secondary | ICD-10-CM | POA: Diagnosis not present

## 2022-04-10 LAB — POC COVID19 BINAXNOW: SARS Coronavirus 2 Ag: POSITIVE — AB

## 2022-04-10 MED ORDER — DOXYCYCLINE HYCLATE 100 MG PO TABS: 100.0000 mg | ORAL_TABLET | Freq: Two times a day (BID) | ORAL | 0 refills | Status: DC

## 2022-04-10 NOTE — Progress Notes (Signed)
Haley Fields , [birth year], 71 y.o., female MRN: [id number] Patient Care Team    Relationship Specialty Notifications Start End  Natalia Leatherwood, DO PCP - General Family Medicine  06/21/15   Sharrell Ku, MD Consulting Physician Gastroenterology  04/24/16     Chief Complaint  Patient presents with   Nasal Congestion    Pt c/o nasal congestion, HA, ear fullness, fatigue low grade temp of a 100 since 08/08; pt reports nasal cavity itching      Subjective: Pt presents for an OV with complaints of nasal congestion, itching (nose), headache, ear fullness and low grade temp that started on 8/8. Haley Fields has not taken a covid test.  Haley Fields is taking her flonase and allegra.     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 Haley Fields has 2 grown sons, 3 grandsons and 1 granddaughter. Haley Fields lives in [place] with her fiance.   Past Medical History:  Diagnosis Date   Allergy    Bladder prolapse, female, acquired    Syncope    pt states due to "inner ear". cardiac & stroke w/u NEG.   Vitamin D deficiency    Past Surgical History:  Procedure Laterality Date   LEG SURGERY Left    injury. lateral lower leg, hardware   TONSILLECTOMY AND ADENOIDECTOMY     Family History  Problem Relation Age of Onset   Alzheimer's disease Mother    Hypertension Mother    Cancer Father        lung, asbestos, Curator   Cancer Brother        pancreatic, agent orange   Glaucoma Paternal Grandfather    Allergies as of 04/10/2022   No Known Allergies      Medication List        Accurate as of April 10, 2022 10:46 AM. If you have any questions, ask your nurse or doctor.          [facility]S Vitamin C 1000 MG  tablet Generic drug: ascorbic acid Take 2,000 mg by mouth daily.   doxycycline 100 MG tablet Commonly known as: VIBRA-TABS Take 1 tablet (100 mg total) by mouth 2 (two) times daily. Started by: Felix Pacini, DO   fexofenadine 30 MG tablet Commonly known as: ALLEGRA Take 30 mg by mouth 2 (two) times daily.   fluticasone 50 MCG/ACT nasal spray Commonly known as: FLONASE Place 2 sprays into both nostrils as needed for allergies or rhinitis.   Vitamin D3 25 MCG (1000 UT) Caps Take 2,000 Units by mouth daily.   Zinc 50 MG Tabs Take 1 tablet by mouth daily.        All past medical history, surgical history, allergies, family history, immunizations andmedications were updated in the EMR today and reviewed under the history and medication portions of their EMR.     Review of Systems  Constitutional:  Positive for fever and malaise/fatigue. Negative for chills and weight loss.  HENT:  Positive for congestion, sinus pain and sore throat. Negative for ear discharge and ear pain.   Respiratory:  Negative for  cough, shortness of breath and wheezing.   Skin:  Negative for rash.  Neurological:  Positive for headaches.   Negative, with the exception of above mentioned in HPI   Objective:  BP 123/81   Pulse 83   Temp 98.3 F (36.8 C) (Oral)   Ht 5\' 4"  (1.626 m)   Wt 147 lb (66.7 kg)   SpO2 97%   BMI 25.23 kg/m  Body mass index is 25.23 kg/m. Physical Exam Vitals and nursing note reviewed.  Constitutional:      General: Haley Fields is not in acute distress.    Appearance: Normal appearance. Haley Fields is normal weight. Haley Fields is not ill-appearing or toxic-appearing.  HENT:     Head: Normocephalic and atraumatic.     Right Ear: Tympanic membrane and ear canal normal.     Left Ear: Tympanic membrane and ear canal normal.     Nose: Congestion and rhinorrhea present.     Mouth/Throat:     Pharynx: Posterior oropharyngeal erythema present. No oropharyngeal exudate.  Eyes:     General:         Right eye: No discharge.        Left eye: No discharge.     Extraocular Movements: Extraocular movements intact.     Conjunctiva/sclera: Conjunctivae normal.     Pupils: Pupils are equal, round, and reactive to light.  Cardiovascular:     Rate and Rhythm: Normal rate and regular rhythm.  Pulmonary:     Effort: Pulmonary effort is normal.     Breath sounds: Normal breath sounds. No wheezing, rhonchi or rales.  Musculoskeletal:     Cervical back: Neck supple.  Lymphadenopathy:     Cervical: No cervical adenopathy.  Skin:    Findings: No rash.  Neurological:     Mental Status: Haley Fields is alert and oriented to person, place, and time. Mental status is at baseline.  Psychiatric:        Mood and Affect: Mood normal.        Behavior: Behavior normal.        Thought Content: Thought content normal.        Judgment: Judgment normal.    No results found. No results found. Results for orders placed or performed in visit on 04/10/22 (from the past 24 hour(s))  POC COVID-19 BinaxNow     Status: Abnormal   Collection Time: 04/10/22 10:31 AM  Result Value Ref Range   SARS Coronavirus 2 Ag Positive (A) Negative    Assessment/Plan: Haley Fields is a 71 y.o. female present for OV for  Nasal congestion/covid 19 - POC COVID-19 BinaxNow Rest, hydrate.  +/- flonase, mucinex (DM if cough), nettie pot or nasal saline.  Doxy bid (sinus)  prescribed, take until completed.  F/U 2 weeks if not improved.  Reviewed home care instructions for COVID. Advised self-isolation at home for at least 5 days. After 5 days, if improved and fever resolved, can be in public, but should wear a mask around others for an additional 5 days. If symptoms, esp, dyspnea develops/worsens, recommend in-person evaluation at either an urgent care or the emergency room.   Reviewed expectations re: course of current medical issues. Discussed self-management of symptoms. Outlined signs and symptoms indicating need for more acute  intervention. Patient verbalized understanding and all questions were answered. Patient received an After-Visit Summary.    Orders Placed This Encounter  Procedures   POC COVID-19 BinaxNow   Meds ordered this encounter  Medications   doxycycline (VIBRA-TABS) 100  MG tablet    Sig: Take 1 tablet (100 mg total) by mouth 2 (two) times daily.    Dispense:  14 tablet    Refill:  0   Referral Orders  No referral(s) requested today     Note is dictated utilizing voice recognition software. Although note has been proof read prior to signing, occasional typographical errors still can be missed. If any questions arise, please do not hesitate to call for verification.   electronically signed by:  Felix Pacini, DO  [facility] [place]

## 2022-04-10 NOTE — Patient Instructions (Signed)
Infection Prevention in the Home If you have an infection, may have been exposed to an infection, or are taking care of someone who has an infection, it is important to know how to keep the infection from spreading. Follow your health care provider's instructions and use these guidelines to help stop the spread of infection. How infections are spread In order for an infection to spread, the following must be present: A germ. This may be a virus, bacteria, fungus, or parasite. A place for the germ to live. This may be: On or in a person, animal, plant, or food. In soil or water. On surfaces, such as a door handle. A person or animal who can develop a disease if the germ enters the body (host). The host does not have resistance to the germ. A way for the germ to enter the host. This may occur by: Direct contact with an infected person or animal. This can happen through shaking hands or hugging. Some germs can also travel through the air and spread to others. This can happen when an infected person coughs or sneezes on or near other people. Indirect contact. This occurs when the germ enters the host through contact with an infected object. Examples include: Eating or drinking food or water that is contaminated with the germ. Touching a contaminated surface with your hands, and then touching your face, eyes, nose, or mouth. Supplies needed: Soap. Alcohol-based hand sanitizer. Standard cleaning products. Disinfectants, such as bleach. Reusable cleaning cloths, sponges, or paper towels. Disposable or reusable utility gloves. How to prevent infection from spreading There are several things that you can do to help prevent infection from spreading. Take these general actions Everyone should take the following actions to prevent the spread of infection: Wash your hands often with soap and water for at least 20 seconds. If soap and water are not available, use alcohol-based hand sanitizer. Avoid  touching your face, mouth, nose, or eyes. Cough or sneeze into a tissue, sleeve, or elbow instead of into your hand or into the air. If you cough or sneeze into a tissue, throw it away immediately and wash your hands.  Keep your bathroom clean Provide soap. Change towels and washcloths frequently. Change toothbrushes often and store them separately in a clean, dry place. Clean and disinfect all surfaces, including the toilet, floor, tub, shower, and sink. Do not share personal items, such as razors, toothbrushes, deodorant, combs, brushes, towels, and washcloths. Maintain hygiene in the kitchen  Wash your hands before and after preparing food and before you eat. Clean the inside of your refrigerator each week. Keep your refrigerator set at 40F (4C) or less, and set your freezer at 0F (-18C) or less. Keep work surfaces clean. Disinfect them regularly. Wash your dishes in hot, soapy water. Air-dry your dishes or use a dishwasher. Do not share dishes or eating utensils. Handle food safely Store food carefully. Refrigerate leftovers promptly in covered containers. Throw out stale or spoiled food. Thaw foods in the refrigerator or microwave, not at room temperature. Serve foods at the proper temperature. Do not eat raw meat. Make sure it is cooked to the appropriate temperature. Cook eggs until they are firm. Wash fruits and vegetables under running water. Use separate cutting boards, plates, and utensils for raw foods and cooked foods. Use a clean spoon each time you sample food while cooking. Do laundry the right way Wear gloves if laundry is visibly soiled. Do not shake soiled laundry. Doing that may send germs   into the air. Wash laundry in hot water. If you cannot wash the laundry right away, place it in a plastic bag and wash it as soon as possible. Be careful around animals and pets Wash your hands before and after touching animals. If you have a pet, ensure that your pet stays  clean. Do not let people with weak immune systems touch bird droppings, fish tank water, or a litter box. If you have a pet cage or litter box, be sure to clean it every day. If you are sick, stay away from animals and have someone else care for them if possible. How to clean and disinfect objects and surfaces Precautions Some disinfectants work for certain germs and not others. Read the manufacturer's instructions or read online resources to determine if the product you are using will work for the germ you are trying to remove. If you choose to use bleach, use it safely. Never mix it with other cleaning products, especially those that contain ammonia. This mixture can create a dangerous gas that may be deadly. Keep proper movement of fresh air in your home (ventilation). Pour used mop water down the utility sink or toilet. Do not pour this water down the kitchen sink. Objects and surfaces  If surfaces are visibly soiled, clean them first with soap and water before disinfecting. Disinfect surfaces that are frequently touched every day. This may include: Counters. Tables. Doorknobs. Sinks and faucets. Electronics, such as: Phones. Remote controls. Keyboards. Computers and tablets. Cleaning supplies Some cleaning supplies can breed germs. Take good care of them to prevent germs from spreading. To do this: Soak toilet brushes, mops, and sponges in bleach and water for 5 minutes after each use, or according to manufacturer's instructions. Wash reusable cleaning cloths and sanitize sponges after each use. Throw away disposable gloves after one use. Replace reusable utility gloves if they are cracked or torn or if they start to peel. Additional actions if you are sick If you live with other people:  Avoid close contact with those around you. Stay at least 3 ft (1 m) away from others, if possible. Use a separate bathroom, if possible. If possible, sleep in a separate bedroom or in a  separate bed to prevent infecting other household members. Change bedroom linens each week or whenever they are soiled. Have everyone in the household wash hands often with soap and water for at least 20 seconds. If soap and water are not available, use alcohol-based hand sanitizer. In general: Stay home except to get medical care. Call ahead before visiting your health care provider. Ask others to get groceries and household supplies and to refill prescriptions for you. Avoid public areas. Try not to take public transportation. If you can, wear a mask if you need to go out of the house, or if you are in close contact with someone who is not sick. Avoid visitors until you have completely recovered, or until you have no signs and symptoms of infection. Avoid preparing food or providing care for others. If you must prepare food or provide care for others, wear a mask and wash your hands before and after doing these things. Where to find more information Centers for Disease Control and Prevention: cdc.gov Summary It is important to know how to keep infection from spreading. Make sure everyone in your household washes their hands often with soap and water. Disinfect surfaces that are frequently touched every day. If you are sick, stay home except to get medical care. This   information is not intended to replace advice given to you by your health care provider. Make sure you discuss any questions you have with your health care provider. Document Revised: 10/02/2021 Document Reviewed: 10/02/2021 [employer] Patient Education  2023 [employer]  

## 2022-06-12 ENCOUNTER — Ambulatory Visit (INDEPENDENT_AMBULATORY_CARE_PROVIDER_SITE_OTHER): Payer: Medicare Other

## 2022-06-12 DIAGNOSIS — Z23 Encounter for immunization: Secondary | ICD-10-CM

## 2022-06-18 ENCOUNTER — Other Ambulatory Visit: Payer: Self-pay | Admitting: Family Medicine

## 2022-06-18 DIAGNOSIS — Z1231 Encounter for screening mammogram for malignant neoplasm of breast: Secondary | ICD-10-CM

## 2022-08-10 ENCOUNTER — Ambulatory Visit
Admission: RE | Admit: 2022-08-10 | Discharge: 2022-08-10 | Disposition: A | Discharge status: Home / Self Care | Payer: Medicare Other | Source: Ambulatory Visit | Attending: Family Medicine | Admitting: Family Medicine

## 2022-08-10 DIAGNOSIS — Z1231 Encounter for screening mammogram for malignant neoplasm of breast: Secondary | ICD-10-CM | POA: Diagnosis not present

## 2022-08-15 ENCOUNTER — Ambulatory Visit (INDEPENDENT_AMBULATORY_CARE_PROVIDER_SITE_OTHER): Payer: Medicare Other

## 2022-08-15 DIAGNOSIS — Z0000 Encounter for general adult medical examination without abnormal findings: Secondary | ICD-10-CM | POA: Diagnosis not present

## 2022-08-15 NOTE — Progress Notes (Signed)
Subjective:   Haley Fields is a 71 y.o. female who presents for Medicare Annual (Subsequent) preventive examination.  I connected with  Haley Fields on 08/15/22 by an audio only telemedicine application and verified that I am speaking with the correct person using two identifiers.   I discussed the limitations, risks, security and privacy concerns of performing an evaluation and management service by telephone and the availability of in person appointments. I also discussed with the patient that there may be a patient responsible charge related to this service. The patient expressed understanding and verbally consented to this telephonic visit.  Location of Patient: home Location of Provider:office  List any persons and their role that are participating in the visit with the patient.   Haley Fields, Haley Fields  Review of Systems    Defer to PCP Cardiac Risk Factors include: advanced age (>4455men, 48>65 women)     Objective:    There were no vitals filed for this visit. There is no height or weight on file to calculate BMI.     08/15/2022    3:46 PM 08/09/2021    3:13 PM 08/03/2020   10:31 AM 08/03/2019    2:06 PM 08/03/2019    1:54 PM 07/29/2017   10:21 AM  Advanced Directives  Does Patient Have a Medical Advance Directive? No Yes No No No No  Does patient want to make changes to medical advance directive?  Yes (MAU/Ambulatory/Procedural Areas - Information given)  Yes (MAU/Ambulatory/Procedural Areas - Information given)    Would patient like information on creating a medical advance directive? No - Patient declined  No - Patient declined   Yes (MAU/Ambulatory/Procedural Areas - Information given)    Current Medications (verified) Outpatient Encounter Medications as of 08/15/2022  Medication Sig   Cholecalciferol (VITAMIN D3) 1000 units CAPS Take 2,000 Units by mouth daily.   CVS VITAMIN C 1000 MG tablet Take 2,000 mg by mouth daily. (Patient not taking: Reported  on 04/10/2022)   doxycycline (VIBRA-TABS) 100 MG tablet Take 1 tablet (100 mg total) by mouth 2 (two) times daily.   fexofenadine (ALLEGRA) 30 MG tablet Take 30 mg by mouth 2 (two) times daily.   fluticasone (FLONASE) 50 MCG/ACT nasal spray Place 2 sprays into both nostrils as needed for allergies or rhinitis.   Zinc 50 MG TABS Take 1 tablet by mouth daily. (Patient not taking: Reported on 04/10/2022)   No facility-administered encounter medications on file as of 08/15/2022.    Allergies (verified) Patient has no known allergies.   History: Past Medical History:  Diagnosis Date   Allergy    Bladder prolapse, female, acquired    Syncope    pt states due to "inner ear". cardiac & stroke w/u NEG.   Vitamin D deficiency    Past Surgical History:  Procedure Laterality Date   LEG SURGERY Left    injury. lateral lower leg, hardware   TONSILLECTOMY AND ADENOIDECTOMY     Family History  Problem Relation Age of Onset   Alzheimer's disease Mother    Hypertension Mother    Cancer Father        lung, asbestos, Curatormechanic   Cancer Brother        pancreatic, agent orange   Glaucoma Paternal Grandfather    Social History   Socioeconomic History   Marital status: Married    Spouse name: Not on file   Number of children: 2   Years of education: Not on file   Highest education  level: Not on file  Occupational History   Occupation: retired    Comment: Charity fundraiser, ICU  Tobacco Use   Smoking status: Former   Smokeless tobacco: Never  Building services engineer Use: Never used  Substance and Sexual Activity   Alcohol use: Yes    Comment: occasional   Drug use: No   Sexual activity: Never  Other Topics Concern   Not on file  Social History Narrative   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 She lives in [place] with her fiance.   Social Determinants of Health   Financial Resource Strain: Low Risk  (08/15/2022)   Overall Financial Resource Strain  (CARDIA)    Difficulty of Paying Living Expenses: Not hard at all  Food Insecurity: No Food Insecurity (08/15/2022)   Hunger Vital Sign    Worried About Running Out of Food in the Last Year: Never true    Ran Out of Food in the Last Year: Never true  Transportation Needs: No Transportation Needs (08/15/2022)   PRAPARE - Administrator, Civil Service (Medical): No    Lack of Transportation (Non-Medical): No  Physical Activity: Sufficiently Active (08/15/2022)   Exercise Vital Sign    Days of Exercise per Week: 4 days    Minutes of Exercise per Session: 50 min  Stress: No Stress Concern Present (08/15/2022)   [employer] of Occupational Health - Occupational Stress Questionnaire    Feeling of Stress : Not at all  Social Connections: Moderately Integrated (08/15/2022)   Social Connection and Isolation Panel [NHANES]    Frequency of Communication with Friends and Family: More than three times a week    Frequency of Social Gatherings with Friends and Family: Twice a week    Attends Religious Services: More than 4 times per year    Active Member of [employer] or Organizations: No    Attends Engineer, structural: Never    Marital Status: Married    Tobacco Counseling Counseling given: Not Answered   Clinical Intake:  Pre-visit preparation completed: No  Pain : No/denies pain     Nutritional Risks: None Diabetes: No  How often do you need to have someone help you when you read instructions, pamphlets, or other written materials from your doctor or pharmacy?: 1 - Never What is the last grade level you completed in school?: 2 years of college  Diabetic?no  Interpreter Needed?: No      Activities of Daily Living    08/15/2022    3:46 PM  In your present state of health, do you have any difficulty performing the following activities:  Hearing? 0  Vision? 0  Difficulty concentrating or making decisions? 0  Walking or climbing stairs? 0  Dressing or  bathing? 0  Doing errands, shopping? 0  Preparing Food and eating ? N  Using the Toilet? N  In the past six months, have you accidently leaked urine? N  Do you have problems with loss of bowel control? N  Managing your Medications? N  Managing your Finances? N  Housekeeping or managing your Housekeeping? N    Patient Care Team: Natalia Leatherwood, DO as PCP - General (Family Medicine) Sharrell Ku, MD as Consulting Physician (Gastroenterology)  Indicate any recent Medical Services you may have received from other than [facility] providers in the past year (date may be approximate).     Assessment:   This is a routine wellness examination for  Haley Fields.  Hearing/Vision screen No results found.  Dietary issues and exercise activities discussed: Exercise limited by: None identified   Goals Addressed             This Visit's Progress    Quality of Life Maintained       Evidence-based guidance:  Assess patient's thoughts about quality of life, goals and expectations, and dissatisfaction or desire to improve.  Identify issues of primary importance such as mental health, illness, exercise tolerance, pain, sexual function and intimacy, cognitive change, social isolation, finances and relationships.  Assess and monitor for signs/symptoms of psychosocial concerns, especially depression or ideations regarding harm to others or self; provide or refer for mental health services as needed.  Identify sensory issues that impact quality of life such as hearing loss, vision deficit; strategize ways to maintain or improve hearing, vision.  Promote access to services in the community to support independence such as support groups, home visiting programs, financial assistance, handicapped parking tags, durable medical equipment and emergency responder.  Promote activities to decrease social isolation such as group support or social, leisure and recreational activities, employment, use of social media;  consider safety concerns about being out of home for activities.  Provide patient an opportunity to share by storytelling or a "life review" to give positive meaning to life and to assist with coping and negative experiences.  Encourage patient to tap into hope to improve sense of self.  Counsel based on prognosis and as early as possible about end-of-life and palliative care; consider referral to palliative care provider.  Advocate for the development of palliative care plan that may include avoidance of unnecessary testing and intervention, symptom control, discontinuation of medications, hospice and organ donation.  Counsel as early as possible those with life-limiting chronic disease about palliative care; consider referral to palliative care provider.  Advocate for the development of palliative care plan.   Notes:        Depression Screen    08/15/2022    3:44 PM 08/09/2021    3:12 PM 08/03/2020   10:33 AM 08/03/2019    1:58 PM 07/29/2017   10:21 AM 04/24/2016   10:53 AM 12/14/2013    2:35 PM  PHQ 2/9 Scores  PHQ - 2 Score 0 0 0 0 0 0 0    Fall Risk    08/15/2022    3:46 PM 08/09/2021    3:19 PM 08/03/2020   10:33 AM 08/03/2019    1:58 PM 03/26/2019    5:34 PM  Fall Risk   Falls in the past year? 0 0 0 1   Comment     Emmi Telephone Survey: data to providers prior to load  Number falls in past yr: 0 0 0 0   Comment     Emmi Telephone Survey Actual Response =   Injury with Fall? 0 0 0 1   Follow up Falls evaluation completed Falls prevention discussed Falls prevention discussed Falls evaluation completed;Education provided;Falls prevention discussed     FALL RISK PREVENTION PERTAINING TO THE HOME:  Any stairs in or around the home? Yes  If so, are there any without handrails? No  Home free of loose throw rugs in walkways, pet beds, electrical cords, etc? Yes  Adequate lighting in your home to reduce risk of falls? Yes   ASSISTIVE DEVICES UTILIZED TO PREVENT FALLS:  Life  alert? No  Use of a cane, walker or w/c? No  Grab bars in the bathroom? No  Shower chair  or bench in shower? Yes  Elevated toilet seat or a handicapped toilet? Yes   TIMED UP AND GO:  Was the test performed? No .  Length of time to ambulate 10 feet: n/a sec.     Cognitive Function:        08/15/2022    3:07 PM 08/09/2021    3:20 PM  6CIT Screen  What Year? 0 points 0 points  What month? 0 points 0 points  What time? 0 points 0 points  Count back from 20 0 points 0 points  Months in reverse 2 points 0 points  Repeat phrase 0 points 0 points  Total Score 2 points 0 points    Immunizations Immunization History  Administered Date(s) Administered   Fluad Quad(high Dose 65+) 06/03/2019, 06/23/2020, 06/20/2021, 06/12/2022   Influenza, High Dose Seasonal PF 05/30/2016, 05/30/2017, 06/09/2018   Influenza,inj,Quad PF,6+ Mos 05/20/2014, 06/21/2015   Moderna Sars-Covid-2 Vaccination 10/02/2019, 10/31/2019   Pneumococcal Conjugate-13 04/24/2016   Pneumococcal Polysaccharide-23 07/29/2017   Tdap 06/22/2019   Zoster, Live 05/14/2014    TDAP status: Up to date  Flu Vaccine status: Up to date  Pneumococcal vaccine status: Up to date  Covid-19 vaccine status: Completed vaccines  Qualifies for Shingles Vaccine? Yes   Zostavax completed No   Shingrix Completed?: No.    Education has been provided regarding the importance of this vaccine. Patient has been advised to call insurance company to determine out of pocket expense if they have not yet received this vaccine. Advised may also receive vaccine at local pharmacy or Health Dept. Verbalized acceptance and understanding.  Screening Tests Health Maintenance  Topic Date Due   Zoster Vaccines- Shingrix (1 of 2) Never done   COLONOSCOPY (Pts 45-55yrs Insurance coverage will need to be confirmed)  12/14/2020   COVID-19 Vaccine (3 - 2023-24 season) 04/27/2022   DEXA SCAN  08/05/2022   Medicare Annual Wellness (AWV)  08/16/2023    MAMMOGRAM  08/10/2024   DTaP/Tdap/Td (2 - Td or Tdap) 06/21/2029   Pneumonia Vaccine 17+ Years old  Completed   INFLUENZA VACCINE  Completed   Hepatitis C Screening  Completed   HPV VACCINES  Aged Out    Health Maintenance  Health Maintenance Due  Topic Date Due   Zoster Vaccines- Shingrix (1 of 2) Never done   COLONOSCOPY (Pts 45-57yrs Insurance coverage will need to be confirmed)  12/14/2020   COVID-19 Vaccine (3 - 2023-24 season) 04/27/2022   DEXA SCAN  08/05/2022    Colorectal cancer screening: Type of screening: Colonoscopy. Completed 12/15/2010. Repeat every 10 years  Mammogram status: Completed 08/10/22. Repeat every year  Bone Density status: Completed 12/102020. Results reflect: Bone density results: OSTEOPENIA. Repeat every 2 years.  Lung Cancer Screening: (Low Dose CT Chest recommended if Age 31-80 years, 30 pack-year currently smoking OR have quit w/in 15years.) does not qualify.   Lung Cancer Screening Referral: n/a  Additional Screening:  Hepatitis C Screening: does qualify; Completed 04/24/2016  Vision Screening: Recommended annual ophthalmology exams for early detection of glaucoma and other disorders of the eye. Is the patient up to date with their annual eye exam?  No  Who is the provider or what is the name of the office in which the patient attends annual eye exams? N/a If pt is not established with a provider, would they like to be referred to a provider to establish care? No .   Dental Screening: Recommended annual dental exams for proper oral hygiene  Community Resource Referral /  Chronic Care Management: CRR required this visit?  No   CCM required this visit?  No      Plan:     I have personally reviewed and noted the following in the patient's chart:   Medical and social history Use of alcohol, tobacco or illicit drugs  Current medications and supplements including opioid prescriptions. Patient is not currently taking opioid  prescriptions. Functional ability and status Nutritional status Physical activity Advanced directives List of other physicians Hospitalizations, surgeries, and ER visits in previous 12 months Vitals Screenings to include cognitive, depression, and falls Referrals and appointments  In addition, I have reviewed and discussed with patient certain preventive protocols, quality metrics, and best practice recommendations. A written personalized care plan for preventive services as well as general preventive health recommendations were provided to patient.     Filomena Jungling, Haley Fields   08/15/2022   Nurse Notes: Non-Face to Face or Face to Face 10 minute visit Encounter    Haley Fields , Thank you for taking time to come for your Medicare Wellness Visit. I appreciate your ongoing commitment to your health goals. Please review the following plan we discussed and let me know if I can assist you in the future.   These are the goals we discussed:  Goals      Patient Stated     Maintain current weight.      Patient Stated     Increase activity     Patient Stated     Stay healthy and active      Quality of Life Maintained     Evidence-based guidance:  Assess patient's thoughts about quality of life, goals and expectations, and dissatisfaction or desire to improve.  Identify issues of primary importance such as mental health, illness, exercise tolerance, pain, sexual function and intimacy, cognitive change, social isolation, finances and relationships.  Assess and monitor for signs/symptoms of psychosocial concerns, especially depression or ideations regarding harm to others or self; provide or refer for mental health services as needed.  Identify sensory issues that impact quality of life such as hearing loss, vision deficit; strategize ways to maintain or improve hearing, vision.  Promote access to services in the community to support independence such as support groups, home visiting  programs, financial assistance, handicapped parking tags, durable medical equipment and emergency responder.  Promote activities to decrease social isolation such as group support or social, leisure and recreational activities, employment, use of social media; consider safety concerns about being out of home for activities.  Provide patient an opportunity to share by storytelling or a "life review" to give positive meaning to life and to assist with coping and negative experiences.  Encourage patient to tap into hope to improve sense of self.  Counsel based on prognosis and as early as possible about end-of-life and palliative care; consider referral to palliative care provider.  Advocate for the development of palliative care plan that may include avoidance of unnecessary testing and intervention, symptom control, discontinuation of medications, hospice and organ donation.  Counsel as early as possible those with life-limiting chronic disease about palliative care; consider referral to palliative care provider.  Advocate for the development of palliative care plan.   Notes:         This is a list of the screening recommended for you and due dates:  Health Maintenance  Topic Date Due   Zoster (Shingles) Vaccine (1 of 2) Never done   Colon Cancer Screening  12/14/2020   COVID-19  Vaccine (3 - 2023-24 season) 04/27/2022   DEXA scan (bone density measurement)  08/05/2022   Medicare Annual Wellness Visit  08/16/2023   Mammogram  08/10/2024   DTaP/Tdap/Td vaccine (2 - Td or Tdap) 06/21/2029   Pneumonia Vaccine  Completed   Flu Shot  Completed   Hepatitis C Screening: USPSTF Recommendation to screen - Ages 18-79 yo.  Completed   HPV Vaccine  Aged Out

## 2022-08-15 NOTE — Patient Instructions (Signed)

## 2022-09-10 DIAGNOSIS — M5136 Other intervertebral disc degeneration, lumbar region: Secondary | ICD-10-CM | POA: Diagnosis not present

## 2022-09-10 DIAGNOSIS — M9903 Segmental and somatic dysfunction of lumbar region: Secondary | ICD-10-CM | POA: Diagnosis not present

## 2022-09-11 DIAGNOSIS — M9903 Segmental and somatic dysfunction of lumbar region: Secondary | ICD-10-CM | POA: Diagnosis not present

## 2022-09-11 DIAGNOSIS — M5136 Other intervertebral disc degeneration, lumbar region: Secondary | ICD-10-CM | POA: Diagnosis not present

## 2022-09-13 DIAGNOSIS — M5136 Other intervertebral disc degeneration, lumbar region: Secondary | ICD-10-CM | POA: Diagnosis not present

## 2022-09-13 DIAGNOSIS — M9903 Segmental and somatic dysfunction of lumbar region: Secondary | ICD-10-CM | POA: Diagnosis not present

## 2023-04-18 ENCOUNTER — Encounter: Payer: Self-pay | Admitting: Family Medicine

## 2023-04-18 ENCOUNTER — Ambulatory Visit (HOSPITAL_BASED_OUTPATIENT_CLINIC_OR_DEPARTMENT_OTHER)
Admission: RE | Admit: 2023-04-18 | Discharge: 2023-04-18 | Disposition: A | Discharge status: Home / Self Care | Payer: Medicare Other | Source: Ambulatory Visit | Attending: Family Medicine | Admitting: Family Medicine

## 2023-04-18 ENCOUNTER — Ambulatory Visit (INDEPENDENT_AMBULATORY_CARE_PROVIDER_SITE_OTHER): Payer: Medicare Other | Admitting: Family Medicine

## 2023-04-18 ENCOUNTER — Telehealth: Payer: Self-pay | Admitting: Family Medicine

## 2023-04-18 VITALS — BP 138/83 | HR 66 | Temp 97.9°F | Wt 153.2 lb

## 2023-04-18 DIAGNOSIS — R0781 Pleurodynia: Secondary | ICD-10-CM | POA: Diagnosis not present

## 2023-04-18 DIAGNOSIS — S2341XA Sprain of ribs, initial encounter: Secondary | ICD-10-CM | POA: Diagnosis not present

## 2023-04-18 NOTE — Telephone Encounter (Signed)
Please inform patient the following information: Her xray does not show any fractures.  Pain is likely from rib  muscle strain. She should see improvement with OTC NSAID use within the next few weeks. Rib strains can take quite a few weeks to fully resolve

## 2023-04-18 NOTE — Telephone Encounter (Signed)
Spoke with patient regarding results/recommendations.  

## 2023-04-18 NOTE — Progress Notes (Signed)
Haley Fields , November 15, 1950, 72 y.o., female MRN: [id number] Patient Care Team    Relationship Specialty Notifications Start End  Natalia Leatherwood, DO PCP - General Family Medicine  06/21/15   Sharrell Ku, MD Consulting Physician Gastroenterology  04/24/16     Chief Complaint  Patient presents with   Flank Pain    1 week; left side; sat down hard while playing with grandaughter     Subjective: Haley Fields is a 72 y.o. Pt presents for an OV with complaints of left lateral chest wall discomfort of 1 week duration.  Associated symptoms include pain with movement and laying flat on her back. Pt has tried Tylenol and ibuprofen to ease their symptoms.  Patient reports about a week ago she and her granddaughter in a fall.  She reports that her feet got tangled in each other and they started to fall forward.  Patient reports she does not recall hitting anything to this area but she did hold onto her granddaughter to prevent her from getting hurt.  Since that time she has noticed discomfort around the left lateral chest wall.  She reports she has no bruising to this area.  There is discomfort when pushing on the area.  She expected it to improve over the last week, but she feels like it has not made any improvement.  Patient denies fevers, chills or shortness of breath.     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 She lives in [place] with her fiance.   Past Medical History:  Diagnosis Date   Allergy    Bladder prolapse, female, acquired    Syncope    pt states due to "inner ear". cardiac & stroke w/u NEG.   Vitamin D deficiency     Past Surgical History:  Procedure Laterality Date   LEG SURGERY Left    injury. lateral lower leg, hardware   TONSILLECTOMY AND ADENOIDECTOMY     Family History  Problem Relation Age of Onset   Alzheimer's disease Mother    Hypertension Mother    Cancer Father        lung, asbestos, Curator   Cancer Brother        pancreatic, agent orange   Glaucoma Paternal Grandfather    Allergies as of 04/18/2023   No Known Allergies      Medication List        Accurate as of April 18, 2023 11:44 AM. If you have any questions, ask your nurse or doctor.          STOP taking these medications    [facility] Vitamin C 1000 MG tablet Generic drug: ascorbic acid Stopped by: Felix Pacini   doxycycline 100 MG tablet Commonly known as: VIBRA-TABS Stopped by: Felix Pacini   fexofenadine 30 MG tablet Commonly known as: ALLEGRA Stopped by: Felix Pacini   fluticasone 50 MCG/ACT nasal spray Commonly known as: FLONASE Stopped by: Felix Pacini   Zinc 50 MG Tabs Stopped by: Felix Pacini  TAKE these medications    Vitamin D3 25 MCG (1000 UT) Caps Take 2,000 Units by mouth daily.        All past medical history, surgical history, allergies, family history, immunizations andmedications were updated in the EMR today and reviewed under the history and medication portions of their EMR.     ROS Negative, with the exception of above mentioned in HPI   Objective:  BP 138/83   Pulse 66   Temp 97.9 F (36.6 C)   Wt 153 lb 3.2 oz (69.5 kg)   SpO2 97%   BMI 26.30 kg/m  Body mass index is 26.3 kg/m. Physical Exam Vitals and nursing note reviewed.  Constitutional:      General: She is not in acute distress.    Appearance: Normal appearance. She is normal weight. She is not ill-appearing or toxic-appearing.  HENT:     Head: Normocephalic and atraumatic.  Eyes:     General: No scleral icterus.       Right eye: No discharge.        Left eye: No discharge.     Extraocular  Movements: Extraocular movements intact.     Conjunctiva/sclera: Conjunctivae normal.     Pupils: Pupils are equal, round, and reactive to light.  Pulmonary:     Effort: Pulmonary effort is normal. No respiratory distress.     Breath sounds: Normal breath sounds. No wheezing, rhonchi or rales.  Musculoskeletal:        General: Swelling and tenderness present.     Comments: No erythema.  No bruising.  No rash.  Tenderness to palpation over left lateral chest wall present.  Skin:    Findings: No rash.  Neurological:     Mental Status: She is alert and oriented to person, place, and time. Mental status is at baseline.     Motor: No weakness.     Coordination: Coordination normal.     Gait: Gait normal.  Psychiatric:        Mood and Affect: Mood normal.        Behavior: Behavior normal.        Thought Content: Thought content normal.        Judgment: Judgment normal.    No results found. DG Ribs Unilateral W/Chest Left  Result Date: 04/18/2023 CLINICAL DATA:  Left rib pain EXAM: LEFT RIBS AND CHEST - 3+ VIEW COMPARISON:  11/17/2008 FINDINGS: No fracture or other bone lesions are seen involving the ribs. There is no evidence of pneumothorax or pleural effusion. Both lungs are clear. Heart size and mediastinal contours are within normal limits. IMPRESSION: Negative. Electronically Signed   By: Duanne Guess D.O.   On: 04/18/2023 10:48   No results found for this or any previous visit (from the past 24 hour(s)).  Assessment/Plan: Haley Fields is a 72 y.o. female present for OV for  Rib pain on left side/rib sprain - DG Ribs Unilateral W/Chest Left; Future -Offered prescribed NSAIDs for her today and she declined.  She states she will continue to take over-the-counter NSAIDs as needed. Will ensure there has not been a rib fracture, and we discussed treatment plan if there was a rib fracture and signs and symptoms that would indicate a need for more acute intervention. Patient will be  called with x-ray results once completed, and further plan discussed at that time if needed.   Reviewed expectations re: course of current medical issues. Discussed self-management of symptoms. Outlined signs and symptoms indicating need for  more acute intervention. Patient verbalized understanding and all questions were answered. Patient received an After-Visit Summary.    Orders Placed This Encounter  Procedures   DG Ribs Unilateral W/Chest Left   No orders of the defined types were placed in this encounter.  Referral Orders  No referral(s) requested today     Note is dictated utilizing voice recognition software. Although note has been proof read prior to signing, occasional typographical errors still can be missed. If any questions arise, please do not hesitate to call for verification.   electronically signed by:  Felix Pacini, DO  [facility] [place]

## 2023-05-30 ENCOUNTER — Ambulatory Visit (INDEPENDENT_AMBULATORY_CARE_PROVIDER_SITE_OTHER): Payer: Medicare Other

## 2023-05-30 DIAGNOSIS — Z23 Encounter for immunization: Secondary | ICD-10-CM

## 2023-06-25 DIAGNOSIS — M25512 Pain in left shoulder: Secondary | ICD-10-CM | POA: Diagnosis not present

## 2023-09-04 ENCOUNTER — Ambulatory Visit: Payer: Medicare Other

## 2024-01-16 ENCOUNTER — Other Ambulatory Visit: Payer: Self-pay | Admitting: Family Medicine

## 2024-01-16 ENCOUNTER — Telehealth: Payer: Self-pay

## 2024-01-16 DIAGNOSIS — Z1231 Encounter for screening mammogram for malignant neoplasm of breast: Secondary | ICD-10-CM

## 2024-01-16 NOTE — Telephone Encounter (Signed)
 A user error has taken place: encounter opened in error, closed for administrative reasons.

## 2024-01-16 NOTE — Telephone Encounter (Signed)
 Communication  Reason for CRM: Pt stated that she has an appt on 01/31/24 to have a mammogram completed. Pt stated that she also needs a referral for a bone density test. Pt would like to know if Dr.Kuneff would send a referral for the bone density test so she can have that completed the same day as the mammogram test.     Spoke with pt. Pt overdue for an AWV; pt scheduled for 6/5 so order can also be placed.

## 2024-01-30 ENCOUNTER — Ambulatory Visit

## 2024-01-30 VITALS — BP 128/77 | HR 73 | Temp 98.2°F | Wt 151.4 lb

## 2024-01-30 DIAGNOSIS — Z0000 Encounter for general adult medical examination without abnormal findings: Secondary | ICD-10-CM | POA: Diagnosis not present

## 2024-01-30 DIAGNOSIS — Z780 Asymptomatic menopausal state: Secondary | ICD-10-CM

## 2024-01-30 DIAGNOSIS — M8580 Other specified disorders of bone density and structure, unspecified site: Secondary | ICD-10-CM

## 2024-01-30 DIAGNOSIS — Z13820 Encounter for screening for osteoporosis: Secondary | ICD-10-CM

## 2024-01-30 NOTE — Progress Notes (Signed)
 Subjective:   Haley Fields is a 73 y.o. female who presents for Medicare Annual (Subsequent) preventive examination.  Visit Complete: In person  Patient Medicare AWV questionnaire was completed by the patient on 01/30/24; I have confirmed that all information answered by patient is correct and no changes since this date.        Objective:    Today's Vitals   01/30/24 1403  BP: 128/77  Pulse: 73  Temp: 98.2 F (36.8 C)  SpO2: 97%  Weight: 151 lb 6.4 oz (68.7 kg)   Body mass index is 25.99 kg/m.     01/30/2024    1:54 PM 08/15/2022    3:46 PM 08/09/2021    3:13 PM 08/03/2020   10:31 AM 08/03/2019    2:06 PM 08/03/2019    1:54 PM 07/29/2017   10:21 AM  Advanced Directives  Does Patient Have a Medical Advance Directive? No No Yes No No No No  Does patient want to make changes to medical advance directive?   Yes (MAU/Ambulatory/Procedural Areas - Information given)  Yes (MAU/Ambulatory/Procedural Areas - Information given)    Would patient like information on creating a medical advance directive? No - Patient declined No - Patient declined  No - Patient declined   Yes (MAU/Ambulatory/Procedural Areas - Information given)    Current Medications (verified) Outpatient Encounter Medications as of 01/30/2024  Medication Sig   Cholecalciferol (VITAMIN D3) 1000 units CAPS Take 2,000 Units by mouth daily.   No facility-administered encounter medications on file as of 01/30/2024.    Allergies (verified) Patient has no known allergies.   History: Past Medical History:  Diagnosis Date   Allergy    Bladder prolapse, female, acquired    Syncope    pt states due to "inner ear". cardiac & stroke w/u NEG.   Vitamin D  deficiency    Past Surgical History:  Procedure Laterality Date   LEG SURGERY Left    injury. lateral lower leg, hardware   TONSILLECTOMY AND ADENOIDECTOMY     Family History  Problem Relation Age of Onset   Alzheimer's disease Mother    Hypertension Mother     Cancer Father        lung, asbestos, Curator   Cancer Brother        pancreatic, agent orange   Glaucoma Paternal Grandfather    Social History   Socioeconomic History   Marital status: Married    Spouse name: Not on file   Number of children: 2   Years of education: Not on file   Highest education level: Not on file  Occupational History   Occupation: retired    Comment: Charity fundraiser, ICU  Tobacco Use   Smoking status: Former   Smokeless tobacco: Never  Advertising account planner   Vaping status: Never Used  Substance and Sexual Activity   Alcohol use: Yes    Comment: occasional   Drug use: No   Sexual activity: Never  Other Topics Concern   Not on file  Social History Narrative   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 She lives in [place] with her fiance.   Social Drivers of Corporate investment banker Strain: Low Risk  (01/30/2024)   Overall Financial Resource Strain (CARDIA)    Difficulty of Paying Living Expenses: Not hard at all  Food Insecurity: No Food Insecurity (01/30/2024)   Hunger Vital Sign    Worried About Running Out of Food in the Last Year:  Never true    Ran Out of Food in the Last Year: Never true  Transportation Needs: No Transportation Needs (01/30/2024)   PRAPARE - Administrator, Civil Service (Medical): No    Lack of Transportation (Non-Medical): No  Physical Activity: Sufficiently Active (01/30/2024)   Exercise Vital Sign    Days of Exercise per Week: 4 days    Minutes of Exercise per Session: 50 min  Stress: No Stress Concern Present (01/30/2024)   [employer] of Occupational Health - Occupational Stress Questionnaire    Feeling of Stress : Not at all  Social Connections: Moderately Integrated (01/30/2024)   Social Connection and Isolation Panel [NHANES]    Frequency of Communication with Friends and Family: More than three times a week    Frequency of Social Gatherings with Friends and Family: Twice a week     Attends Religious Services: More than 4 times per year    Active Member of [employer] or Organizations: No    Attends Engineer, structural: Never    Marital Status: Married    Tobacco Counseling Counseling given: Not Answered   Clinical Intake:  Pre-visit preparation completed: No  Pain : No/denies pain     Nutritional Risks: None Diabetes: No  How often do you need to have someone help you when you read instructions, pamphlets, or other written materials from your doctor or pharmacy?: 1 - Never  Interpreter Needed?: No      Activities of Daily Living    01/30/2024    1:56 PM  In your present state of health, do you have any difficulty performing the following activities:  Hearing? 0  Vision? 0  Difficulty concentrating or making decisions? 0  Walking or climbing stairs? 0  Dressing or bathing? 0  Doing errands, shopping? 0  Preparing Food and eating ? N  Using the Toilet? N  In the past six months, have you accidently leaked urine? N  Do you have problems with loss of bowel control? N  Managing your Medications? N  Managing your Finances? N  Housekeeping or managing your Housekeeping? N    Patient Care Team: Mariel Shope, DO as PCP - General (Family Medicine) Serafin Dames, MD as Consulting Physician (Gastroenterology)  Indicate any recent Medical Services you may have received from other than [facility] providers in the past year (date may be approximate).     Assessment:   This is a routine wellness examination for [place].  Hearing/Vision screen No results found.   Goals Addressed             This Visit's Progress    Increase physical activity       Stay healthy      Depression Screen    01/30/2024    1:52 PM 08/15/2022    3:44 PM 08/09/2021    3:12 PM 08/03/2020   10:33 AM 08/03/2019    1:58 PM 07/29/2017   10:21 AM 04/24/2016   10:53 AM  PHQ 2/9 Scores  PHQ - 2 Score 0 0 0 0 0 0 0    Fall Risk    01/30/2024    1:54 PM  08/15/2022    3:46 PM 08/09/2021    3:19 PM 08/03/2020   10:33 AM 08/03/2019    1:58 PM  Fall Risk   Falls in the past year? 0 0 0 0 1  Number falls in past yr: 0 0 0 0 0  Injury with Fall? 0 0 0 0  1  Risk for fall due to : No Fall Risks      Follow up Falls evaluation completed Falls evaluation completed Falls prevention discussed Falls prevention discussed Falls evaluation completed;Education provided;Falls prevention discussed    MEDICARE RISK AT HOME: Medicare Risk at Home Any stairs in or around the home?: Yes If so, are there any without handrails?: No Home free of loose throw rugs in walkways, pet beds, electrical cords, etc?: Yes Adequate lighting in your home to reduce risk of falls?: Yes Life alert?: No Use of a cane, walker or w/c?: No Grab bars in the bathroom?: No Shower chair or bench in shower?: Yes Elevated toilet seat or a handicapped toilet?: Yes  TIMED UP AND GO:  Was the test performed?  Yes  Length of time to ambulate 10 feet: 5 sec Gait steady and fast without use of assistive device    Cognitive Function:        01/30/2024    1:54 PM 08/15/2022    3:07 PM 08/09/2021    3:20 PM  6CIT Screen  What Year? 0 points 0 points 0 points  What month? 0 points 0 points 0 points  What time? 0 points 0 points 0 points  Count back from 20 0 points 0 points 0 points  Months in reverse 0 points 2 points 0 points  Repeat phrase 0 points 0 points 0 points  Total Score 0 points 2 points 0 points    Immunizations Immunization History  Administered Date(s) Administered   Fluad Quad(high Dose 65+) 06/03/2019, 06/23/2020, 06/20/2021, 06/12/2022   Fluad Trivalent(High Dose 65+) 05/30/2023   Influenza, High Dose Seasonal PF 05/30/2016, 05/30/2017, 06/09/2018   Influenza,inj,Quad PF,6+ Mos 05/20/2014, 06/21/2015   Moderna Sars-Covid-2 Vaccination 10/02/2019, 10/31/2019   Pneumococcal Conjugate-13 04/24/2016   Pneumococcal Polysaccharide-23 07/29/2017   Tdap  06/22/2019   Zoster, Live 05/14/2014    TDAP status: Up to date  Flu Vaccine status: Up to date  Pneumococcal vaccine status: Up to date  Covid-19 vaccine status: Completed vaccines  Qualifies for Shingles Vaccine? Yes   Zostavax completed Yes   Shingrix Completed?: No.    Education has been provided regarding the importance of this vaccine. Patient has been advised to call insurance company to determine out of pocket expense if they have not yet received this vaccine. Advised may also receive vaccine at local pharmacy or Health Dept. Verbalized acceptance and understanding.  Screening Tests Health Maintenance  Topic Date Due   DEXA SCAN  08/05/2022   COVID-19 Vaccine (3 - 2024-25 season) 02/15/2024 (Originally 04/28/2023)   Zoster Vaccines- Shingrix (1 of 2) 05/01/2024 (Originally 04/15/2000)   Colonoscopy  01/29/2025 (Originally 12/14/2020)   INFLUENZA VACCINE  03/27/2024   MAMMOGRAM  08/10/2024   Medicare Annual Wellness (AWV)  01/29/2025   DTaP/Tdap/Td (2 - Td or Tdap) 06/21/2029   Pneumonia Vaccine 92+ Years old  Completed   Hepatitis C Screening  Completed   HPV VACCINES  Aged Out   Meningococcal B Vaccine  Aged Out    Health Maintenance  Health Maintenance Due  Topic Date Due   DEXA SCAN  08/05/2022    Colorectal cancer screening: Type of screening: Colonoscopy. Completed 12/15/2010. Repeat every 10 years  Mammogram status: Completed 08/10/22. Repeat every year  Bone Density status: Ordered 01/30/24. Pt provided with contact info and advised to call to schedule appt.  Lung Cancer Screening: (Low Dose CT Chest recommended if Age 16-80 years, 20 pack-year currently smoking OR have quit w/in  15years.) does not qualify.    Additional Screening:  Hepatitis C Screening: does qualify; Completed 04/24/2016  Vision Screening: Recommended annual ophthalmology exams for early detection of glaucoma and other disorders of the eye. Is the patient up to date with their  annual eye exam?  No  Who is the provider or what is the name of the office in which the patient attends annual eye exams? N/A If pt is not established with a provider, would they like to be referred to a provider to establish care? No .   Dental Screening: Recommended annual dental exams for proper oral hygiene   Community Resource Referral / Chronic Care Management: CRR required this visit?  No   CCM required this visit?  No     Plan:     I have personally reviewed and noted the following in the patient's chart:   Medical and social history Use of alcohol, tobacco or illicit drugs  Current medications and supplements including opioid prescriptions. Patient is not currently taking opioid prescriptions. Functional ability and status Nutritional status Physical activity Advanced directives List of other physicians Hospitalizations, surgeries, and ER visits in previous 12 months Vitals Screenings to include cognitive, depression, and falls Referrals and appointments  In addition, I have reviewed and discussed with patient certain preventive protocols, quality metrics, and best practice recommendations. A written personalized care plan for preventive services as well as general preventive health recommendations were provided to patient.     Roddie Cisco, CMA   01/30/2024   After Visit Summary: (In Person-Declined) Patient declined AVS at this time.  Nurse Notes:  Ms. Howse , Thank you for taking time to come for your Medicare Wellness Visit. I appreciate your ongoing commitment to your health goals. Please review the following plan we discussed and let me know if I can assist you in the future.   These are the goals we discussed:  Goals      Increase physical activity     Stay healthy     Patient Stated     Maintain current weight.      Patient Stated     Increase activity     Patient Stated     Stay healthy and active      Quality of Life Maintained      Evidence-based guidance:  Assess patient's thoughts about quality of life, goals and expectations, and dissatisfaction or desire to improve.  Identify issues of primary importance such as mental health, illness, exercise tolerance, pain, sexual function and intimacy, cognitive change, social isolation, finances and relationships.  Assess and monitor for signs/symptoms of psychosocial concerns, especially depression or ideations regarding harm to others or self; provide or refer for mental health services as needed.  Identify sensory issues that impact quality of life such as hearing loss, vision deficit; strategize ways to maintain or improve hearing, vision.  Promote access to services in the community to support independence such as support groups, home visiting programs, financial assistance, handicapped parking tags, durable medical equipment and emergency responder.  Promote activities to decrease social isolation such as group support or social, leisure and recreational activities, employment, use of social media; consider safety concerns about being out of home for activities.  Provide patient an opportunity to share by storytelling or a "life review" to give positive meaning to life and to assist with coping and negative experiences.  Encourage patient to tap into hope to improve sense of self.  Counsel based on prognosis and as  early as possible about end-of-life and palliative care; consider referral to palliative care provider.  Advocate for the development of palliative care plan that may include avoidance of unnecessary testing and intervention, symptom control, discontinuation of medications, hospice and organ donation.  Counsel as early as possible those with life-limiting chronic disease about palliative care; consider referral to palliative care provider.  Advocate for the development of palliative care plan.   Notes:         This is a list of the screening recommended for you and  due dates:  Health Maintenance  Topic Date Due   DEXA scan (bone density measurement)  08/05/2022   COVID-19 Vaccine (3 - 2024-25 season) 02/15/2024*   Zoster (Shingles) Vaccine (1 of 2) 05/01/2024*   Colon Cancer Screening  01/29/2025*   Flu Shot  03/27/2024   Mammogram  08/10/2024   Medicare Annual Wellness Visit  01/29/2025   DTaP/Tdap/Td vaccine (2 - Td or Tdap) 06/21/2029   Pneumonia Vaccine  Completed   Hepatitis C Screening  Completed   HPV Vaccine  Aged Out   Meningitis B Vaccine  Aged Out  *Topic was postponed. The date shown is not the original due date.

## 2024-01-30 NOTE — Patient Instructions (Signed)

## 2024-01-31 ENCOUNTER — Encounter

## 2024-03-30 ENCOUNTER — Encounter

## 2024-04-20 ENCOUNTER — Ambulatory Visit (HOSPITAL_BASED_OUTPATIENT_CLINIC_OR_DEPARTMENT_OTHER)
Admission: RE | Admit: 2024-04-20 | Discharge: 2024-04-20 | Disposition: A | Discharge status: Home / Self Care | Source: Ambulatory Visit | Attending: Family Medicine | Admitting: Family Medicine

## 2024-04-20 DIAGNOSIS — Z0000 Encounter for general adult medical examination without abnormal findings: Secondary | ICD-10-CM | POA: Diagnosis not present

## 2024-04-20 DIAGNOSIS — M8589 Other specified disorders of bone density and structure, multiple sites: Secondary | ICD-10-CM | POA: Diagnosis not present

## 2024-04-20 DIAGNOSIS — Z780 Asymptomatic menopausal state: Secondary | ICD-10-CM | POA: Diagnosis not present

## 2024-04-20 DIAGNOSIS — Z13820 Encounter for screening for osteoporosis: Secondary | ICD-10-CM | POA: Diagnosis not present

## 2024-04-20 DIAGNOSIS — M8580 Other specified disorders of bone density and structure, unspecified site: Secondary | ICD-10-CM | POA: Diagnosis not present

## 2024-04-20 DIAGNOSIS — Z1231 Encounter for screening mammogram for malignant neoplasm of breast: Secondary | ICD-10-CM | POA: Diagnosis not present

## 2024-04-21 ENCOUNTER — Ambulatory Visit: Payer: Self-pay | Admitting: Family Medicine

## 2024-04-21 DIAGNOSIS — M8580 Other specified disorders of bone density and structure, unspecified site: Secondary | ICD-10-CM

## 2024-04-21 NOTE — Telephone Encounter (Signed)
 Patient has not been seen by this provider in over a year.  Please schedule her for an office visit.  Her bone density was ordered by her starting Medicare wellness appointment.  She does have a decrease in bone density in comparison to prior studies and she is now at a higher risk fracture.  We need to get updated labs for her and discuss treatment for her decrease in bone density.

## 2024-04-22 ENCOUNTER — Ambulatory Visit: Payer: Self-pay | Admitting: Family Medicine

## 2024-05-06 ENCOUNTER — Ambulatory Visit (INDEPENDENT_AMBULATORY_CARE_PROVIDER_SITE_OTHER): Admitting: Family Medicine

## 2024-05-06 ENCOUNTER — Encounter: Payer: Self-pay | Admitting: Family Medicine

## 2024-05-06 VITALS — BP 126/82 | HR 68 | Temp 98.1°F | Wt 152.2 lb

## 2024-05-06 DIAGNOSIS — E559 Vitamin D deficiency, unspecified: Secondary | ICD-10-CM | POA: Diagnosis not present

## 2024-05-06 DIAGNOSIS — Z1211 Encounter for screening for malignant neoplasm of colon: Secondary | ICD-10-CM

## 2024-05-06 DIAGNOSIS — Z23 Encounter for immunization: Secondary | ICD-10-CM | POA: Diagnosis not present

## 2024-05-06 DIAGNOSIS — Z13220 Encounter for screening for lipoid disorders: Secondary | ICD-10-CM | POA: Diagnosis not present

## 2024-05-06 DIAGNOSIS — R7303 Prediabetes: Secondary | ICD-10-CM

## 2024-05-06 DIAGNOSIS — M8589 Other specified disorders of bone density and structure, multiple sites: Secondary | ICD-10-CM | POA: Diagnosis not present

## 2024-05-06 LAB — COMPREHENSIVE METABOLIC PANEL WITH GFR
ALT: 13 U/L (ref 0–35)
AST: 16 U/L (ref 0–37)
Albumin: 4.4 g/dL (ref 3.5–5.2)
Alkaline Phosphatase: 59 U/L (ref 39–117)
BUN: 11 mg/dL (ref 6–23)
CO2: 30 meq/L (ref 19–32)
Calcium: 9.5 mg/dL (ref 8.4–10.5)
Chloride: 103 meq/L (ref 96–112)
Creatinine, Ser: 0.84 mg/dL (ref 0.40–1.20)
GFR: 68.63 mL/min (ref >60.00)
Glucose, Bld: 93 mg/dL (ref 70–99)
Potassium: 4.8 meq/L (ref 3.5–5.1)
Sodium: 140 meq/L (ref 135–145)
Total Bilirubin: 0.6 mg/dL (ref 0.2–1.2)
Total Protein: 6.7 g/dL (ref 6.0–8.3)

## 2024-05-06 LAB — CBC
HCT: 41.7 % (ref 36.0–46.0)
Hemoglobin: 13.7 g/dL (ref 12.0–15.0)
MCHC: 33 g/dL (ref 30.0–36.0)
MCV: 90.7 fl (ref 78.0–100.0)
Platelets: 190 K/uL (ref 150.0–400.0)
RBC: 4.6 Mil/uL (ref 3.87–5.11)
RDW: 13.1 % (ref 11.5–15.5)
WBC: 5.7 K/uL (ref 4.0–10.5)

## 2024-05-06 LAB — TSH: TSH: 1.82 u[IU]/mL (ref 0.35–5.50)

## 2024-05-06 LAB — HEMOGLOBIN A1C: Hgb A1c MFr Bld: 6.3 % (ref 4.6–6.5)

## 2024-05-06 LAB — VITAMIN D 25 HYDROXY (VIT D DEFICIENCY, FRACTURES): VITD: 22.99 ng/mL — ABNORMAL LOW (ref 30.00–100.00)

## 2024-05-06 MED ORDER — ALENDRONATE SODIUM 35 MG PO TABS: 35.0000 mg | ORAL_TABLET | ORAL | 11 refills | Status: AC

## 2024-05-06 NOTE — Progress Notes (Signed)
 Haley Fields , 1951-08-08, 73 y.o., female MRN: [id number] Patient Care Team    Relationship Specialty Notifications Start End  Catherine Charlies LABOR, DO PCP - General Family Medicine  06/21/15   Luis Purchase, MD Consulting Physician Gastroenterology  04/24/16     Chief Complaint  Patient presents with   Review Imaging   Osteopenia     Subjective: Haley Fields is a 73 y.o. Pt presents for an OV to discuss abnormal DEXA screening.  Patient completed bone density with a decrease density result.  T-score -2.0 in the osteopenic range.  Patient reports she does supplement most days with 2000 units of vitamin D .  She does not take a multivitamin or calcium supplement.  Patient reports she does eat a fair amount of calcium rich foods, but does not drink milk.  She exercises routinely.   T-score: -2.0 Major osteoporotic fracture: 19.5% Hip fracture: 4.4%     05/06/2024   10:20 AM 01/30/2024    1:52 PM 08/15/2022    3:44 PM 08/09/2021    3:12 PM 08/03/2020   10:33 AM  Depression screen PHQ 2/9  Decreased Interest 0 0 0 0 0  Down, Depressed, Hopeless 0 0 0 0 0  PHQ - 2 Score 0 0 0 0 0  Altered sleeping 0      Tired, decreased energy 0      Change in appetite 0      Feeling bad or failure about yourself  0      Trouble concentrating 0      Moving slowly or fidgety/restless 0      Suicidal thoughts 0      PHQ-9 Score 0      Difficult doing work/chores Not difficult at all        No Known Allergies Social History   Social History Narrative   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 She lives in [place] with her fiance.   Past Medical History:  Diagnosis Date   Allergy    Bladder prolapse, female, acquired    Syncope    pt states due to inner ear. cardiac & stroke w/u NEG.   Vitamin D  deficiency    Past Surgical History:  Procedure Laterality Date   LEG SURGERY Left    injury. lateral lower leg, hardware    TONSILLECTOMY AND ADENOIDECTOMY     Family History  Problem Relation Age of Onset   Alzheimer's disease Mother    Hypertension Mother    Cancer Father        lung, asbestos, Curator   Cancer Brother        pancreatic, agent orange   Glaucoma Paternal Grandfather    Allergies as of 05/06/2024   No Known Allergies      Medication List        Accurate as of May 06, 2024  3:49 PM. If you have any questions, ask your nurse or doctor.          alendronate  35 MG tablet Commonly known as: FOSAMAX  Take 1 tablet (35 mg total) by mouth every 7 (seven) days. Take with a full glass of water on an empty stomach.   Vitamin D3 25 MCG (1000 UT) Caps Take 2,000 Units by mouth daily.        All past medical history, surgical history, allergies, family history, immunizations andmedications were updated in the EMR today and reviewed under the history  and medication portions of their EMR.     ROS Negative, with the exception of above mentioned in HPI   Objective:  BP 126/82   Pulse 68   Temp 98.1 F (36.7 C)   Wt 152 lb 3.2 oz (69 kg)   SpO2 95%   BMI 26.13 kg/m  Body mass index is 26.13 kg/m.  Physical Exam Vitals and nursing note reviewed.  Constitutional:      General: She is not in acute distress.    Appearance: Normal appearance. She is normal weight. She is not ill-appearing or toxic-appearing.  HENT:     Head: Normocephalic and atraumatic.  Eyes:     General: No scleral icterus.       Right eye: No discharge.        Left eye: No discharge.     Extraocular Movements: Extraocular movements intact.     Conjunctiva/sclera: Conjunctivae normal.     Pupils: Pupils are equal, round, and reactive to light.  Cardiovascular:     Rate and Rhythm: Normal rate and regular rhythm.     Heart sounds: No murmur heard. Pulmonary:     Effort: Pulmonary effort is normal. No respiratory distress.     Breath sounds: Normal breath sounds. No wheezing, rhonchi or rales.   Musculoskeletal:     Cervical back: Neck supple.     Right lower leg: No edema.     Left lower leg: No edema.  Skin:    Findings: No rash.  Neurological:     Mental Status: She is alert and oriented to person, place, and time. Mental status is at baseline.     Motor: No weakness.     Coordination: Coordination normal.     Gait: Gait normal.  Psychiatric:        Mood and Affect: Mood normal.        Behavior: Behavior normal.        Thought Content: Thought content normal.        Judgment: Judgment normal.      No results found. No results found. No results found for this or any previous visit (from the past 24 hours).  Assessment/Plan: Haley Fields is a 73 y.o. female present for OV for  Osteopenia of multiple sites (Primary) We discussed results of DEXA screening today. Encouraged intake of 1000-1200 mg of calcium daily by diet and/or supplement Continue vitamin D  supplementation, vitamin D  levels checked today. Repeat bone density in 2 years Start Fosamax  35 mg weekly.  Discussed administration technique - CBC - Comp Met (CMET) - Vitamin D  (25 hydroxy) - TSH  Vitamin D  deficiency - Vitamin D  (25 hydroxy)  Prediabetes - CBC - Comp Met (CMET) - Hemoglobin A1c - TSH -Continue exercise and dietary modifications  Influenza vaccine needed - Flu vaccine HIGH DOSE PF(Fluzone Trivalent) Colon cancer screening Patient declined referral for colonoscopy.  Is agreeable to Cologuard.  She has had normal colon screenings. - Cologuard    Reviewed expectations re: course of current medical issues. Discussed self-management of symptoms. Outlined signs and symptoms indicating need for more acute intervention. Patient verbalized understanding and all questions were answered. Patient received an After-Visit Summary.    Orders Placed This Encounter  Procedures   Flu vaccine HIGH DOSE PF(Fluzone Trivalent)   CBC   Comp Met (CMET)   Vitamin D  (25 hydroxy)    Hemoglobin A1c   Cologuard   TSH   Meds ordered this encounter  Medications   alendronate  (FOSAMAX ) 35  MG tablet    Sig: Take 1 tablet (35 mg total) by mouth every 7 (seven) days. Take with a full glass of water on an empty stomach.    Dispense:  4 tablet    Refill:  11   Referral Orders  No referral(s) requested today     Note is dictated utilizing voice recognition software. Although note has been proof read prior to signing, occasional typographical errors still can be missed. If any questions arise, please do not hesitate to call for verification.   electronically signed by:  Charlies Bellini, DO  [facility] - [place]

## 2024-05-06 NOTE — Patient Instructions (Signed)
 Return in about 1 year (around 05/06/2025) for Routine chronic condition follow-up.        Great to see you today.  I have refilled the medication(s) we provide.   If labs were collected or images ordered, we will inform you of  results once we have received them and reviewed. We will contact you either by echart message, or telephone call.  Please give ample time to the testing facility, and our office to run,  receive and review results. Please do not call inquiring of results, even if you can see them in your chart. We will contact you as soon as we are able. If it has been over 1 week since the test was completed, and you have not yet heard from us , then please call us .    - echart message- for normal results that have been seen by the patient already.   - telephone call: abnormal results or if patient has not viewed results in their echart.  If a referral to a specialist was entered for you, please call us  in 2 weeks if you have not heard from the specialist office to schedule.

## 2024-05-07 ENCOUNTER — Ambulatory Visit: Payer: Self-pay | Admitting: Family Medicine

## 2024-05-07 NOTE — Telephone Encounter (Signed)
 Communication  Reason for CRM: Pt called to see if Dr. Catherine was going to call in her Vitamin D  since her levels were low.   Pt read MyChart sent by PCP.

## 2024-05-14 DIAGNOSIS — Z1211 Encounter for screening for malignant neoplasm of colon: Secondary | ICD-10-CM | POA: Diagnosis not present

## 2024-05-22 LAB — COLOGUARD: COLOGUARD: NEGATIVE

## 2024-07-28 DIAGNOSIS — L578 Other skin changes due to chronic exposure to nonionizing radiation: Secondary | ICD-10-CM | POA: Diagnosis not present

## 2024-07-28 DIAGNOSIS — L814 Other melanin hyperpigmentation: Secondary | ICD-10-CM | POA: Diagnosis not present

## 2024-07-28 DIAGNOSIS — L570 Actinic keratosis: Secondary | ICD-10-CM | POA: Diagnosis not present

## 2024-07-28 DIAGNOSIS — L821 Other seborrheic keratosis: Secondary | ICD-10-CM | POA: Diagnosis not present

## 2024-07-28 DIAGNOSIS — D1801 Hemangioma of skin and subcutaneous tissue: Secondary | ICD-10-CM | POA: Diagnosis not present

## 2025-02-10 ENCOUNTER — Ambulatory Visit

## 2025-05-06 ENCOUNTER — Ambulatory Visit: Admitting: Family Medicine
